# Patient Record
Sex: Female | Born: 1970
Health system: Southern US, Community
[De-identification: ages and names within clinical notes are randomized; demographics above are authoritative.]

## PROBLEM LIST (undated history)

## (undated) DIAGNOSIS — R569 Unspecified convulsions: Secondary | ICD-10-CM

## (undated) DIAGNOSIS — C801 Malignant (primary) neoplasm, unspecified: Secondary | ICD-10-CM

## (undated) DIAGNOSIS — Z5900 Homelessness unspecified: Secondary | ICD-10-CM

## (undated) DIAGNOSIS — I10 Essential (primary) hypertension: Secondary | ICD-10-CM

## (undated) DIAGNOSIS — E119 Type 2 diabetes mellitus without complications: Secondary | ICD-10-CM

## (undated) DIAGNOSIS — J449 Chronic obstructive pulmonary disease, unspecified: Secondary | ICD-10-CM

## (undated) DIAGNOSIS — H9190 Unspecified hearing loss, unspecified ear: Secondary | ICD-10-CM

## (undated) DIAGNOSIS — H548 Legal blindness, as defined in USA: Secondary | ICD-10-CM

## (undated) DIAGNOSIS — I509 Heart failure, unspecified: Secondary | ICD-10-CM

## (undated) HISTORY — PX: CHOLECYSTECTOMY: SHX55

## (undated) HISTORY — PX: BRAIN SURGERY: SHX531

## (undated) HISTORY — PX: TUBAL LIGATION: SHX77

---

## 2014-08-26 ENCOUNTER — Emergency Department (HOSPITAL_COMMUNITY)
Admission: EM | Admit: 2014-08-26 | Discharge: 2014-08-27 | Disposition: A | Discharge status: Home / Self Care | Payer: Medicaid Other | Attending: Emergency Medicine | Admitting: Emergency Medicine

## 2014-08-26 ENCOUNTER — Encounter (HOSPITAL_COMMUNITY): Payer: Self-pay | Admitting: Emergency Medicine

## 2014-08-26 DIAGNOSIS — Z91040 Latex allergy status: Secondary | ICD-10-CM | POA: Diagnosis not present

## 2014-08-26 DIAGNOSIS — I509 Heart failure, unspecified: Secondary | ICD-10-CM | POA: Diagnosis not present

## 2014-08-26 DIAGNOSIS — J449 Chronic obstructive pulmonary disease, unspecified: Secondary | ICD-10-CM | POA: Diagnosis not present

## 2014-08-26 DIAGNOSIS — R1030 Lower abdominal pain, unspecified: Secondary | ICD-10-CM | POA: Insufficient documentation

## 2014-08-26 DIAGNOSIS — E119 Type 2 diabetes mellitus without complications: Secondary | ICD-10-CM | POA: Diagnosis not present

## 2014-08-26 DIAGNOSIS — Z590 Homelessness: Secondary | ICD-10-CM | POA: Diagnosis not present

## 2014-08-26 DIAGNOSIS — Z720 Tobacco use: Secondary | ICD-10-CM | POA: Diagnosis not present

## 2014-08-26 DIAGNOSIS — I10 Essential (primary) hypertension: Secondary | ICD-10-CM | POA: Diagnosis not present

## 2014-08-26 DIAGNOSIS — Z8669 Personal history of other diseases of the nervous system and sense organs: Secondary | ICD-10-CM | POA: Diagnosis not present

## 2014-08-26 DIAGNOSIS — N939 Abnormal uterine and vaginal bleeding, unspecified: Secondary | ICD-10-CM | POA: Insufficient documentation

## 2014-08-26 DIAGNOSIS — M7989 Other specified soft tissue disorders: Secondary | ICD-10-CM | POA: Diagnosis present

## 2014-08-26 DIAGNOSIS — Z859 Personal history of malignant neoplasm, unspecified: Secondary | ICD-10-CM | POA: Insufficient documentation

## 2014-08-26 DIAGNOSIS — M79605 Pain in left leg: Secondary | ICD-10-CM | POA: Insufficient documentation

## 2014-08-26 DIAGNOSIS — M79606 Pain in leg, unspecified: Secondary | ICD-10-CM

## 2014-08-26 DIAGNOSIS — Z880 Allergy status to penicillin: Secondary | ICD-10-CM | POA: Insufficient documentation

## 2014-08-26 DIAGNOSIS — Z3202 Encounter for pregnancy test, result negative: Secondary | ICD-10-CM | POA: Diagnosis not present

## 2014-08-26 DIAGNOSIS — Z792 Long term (current) use of antibiotics: Secondary | ICD-10-CM | POA: Insufficient documentation

## 2014-08-26 DIAGNOSIS — M79604 Pain in right leg: Secondary | ICD-10-CM | POA: Diagnosis not present

## 2014-08-26 HISTORY — DX: Chronic obstructive pulmonary disease, unspecified: J44.9

## 2014-08-26 HISTORY — DX: Essential (primary) hypertension: I10

## 2014-08-26 HISTORY — DX: Heart failure, unspecified: I50.9

## 2014-08-26 HISTORY — DX: Type 2 diabetes mellitus without complications: E11.9

## 2014-08-26 HISTORY — DX: Malignant (primary) neoplasm, unspecified: C80.1

## 2014-08-26 HISTORY — DX: Homelessness unspecified: Z59.00

## 2014-08-26 HISTORY — DX: Homelessness: Z59.0

## 2014-08-26 HISTORY — DX: Unspecified convulsions: R56.9

## 2014-08-26 LAB — URINALYSIS, ROUTINE W REFLEX MICROSCOPIC
BILIRUBIN URINE: NEGATIVE
GLUCOSE, UA: NEGATIVE mg/dL
Hgb urine dipstick: NEGATIVE
KETONES UR: NEGATIVE mg/dL
Leukocytes, UA: NEGATIVE
Nitrite: NEGATIVE
PH: 5.5 (ref 5.0–8.0)
Protein, ur: NEGATIVE mg/dL
Specific Gravity, Urine: 1.022 (ref 1.005–1.030)
Urobilinogen, UA: 1 mg/dL (ref 0.0–1.0)

## 2014-08-26 LAB — CBC WITH DIFFERENTIAL/PLATELET
Basophils Absolute: 0 10*3/uL (ref 0.0–0.1)
Basophils Relative: 0 % (ref 0–1)
EOS PCT: 3 % (ref 0–5)
Eosinophils Absolute: 0.3 10*3/uL (ref 0.0–0.7)
HEMATOCRIT: 42.9 % (ref 39.0–52.0)
HEMOGLOBIN: 14.7 g/dL (ref 13.0–17.0)
LYMPHS ABS: 3 10*3/uL (ref 0.7–4.0)
LYMPHS PCT: 31 % (ref 12–46)
MCH: 30.2 pg (ref 26.0–34.0)
MCHC: 34.3 g/dL (ref 30.0–36.0)
MCV: 88.3 fL (ref 78.0–100.0)
MONO ABS: 0.6 10*3/uL (ref 0.1–1.0)
MONOS PCT: 7 % (ref 3–12)
Neutro Abs: 5.7 10*3/uL (ref 1.7–7.7)
Neutrophils Relative %: 59 % (ref 43–77)
PLATELETS: 238 10*3/uL (ref 150–400)
RBC: 4.86 MIL/uL (ref 4.22–5.81)
RDW: 13.8 % (ref 11.5–15.5)
WBC: 9.6 10*3/uL (ref 4.0–10.5)

## 2014-08-26 LAB — BASIC METABOLIC PANEL
Anion gap: 13 (ref 5–15)
BUN: 16 mg/dL (ref 6–23)
CHLORIDE: 99 meq/L (ref 96–112)
CO2: 24 meq/L (ref 19–32)
Calcium: 9.1 mg/dL (ref 8.4–10.5)
Creatinine, Ser: 0.85 mg/dL (ref 0.50–1.35)
GFR calc Af Amer: >90 mL/min (ref >90)
GFR calc non Af Amer: 82 mL/min — ABNORMAL LOW (ref >90)
GLUCOSE: 87 mg/dL (ref 70–99)
POTASSIUM: 3.6 meq/L — AB (ref 3.7–5.3)
Sodium: 136 mEq/L — ABNORMAL LOW (ref 137–147)

## 2014-08-26 LAB — PREGNANCY, URINE: Preg Test, Ur: NEGATIVE

## 2014-08-26 LAB — HCG, QUANTITATIVE, PREGNANCY: hCG, Beta Chain, Quant, S: <1 m[IU]/mL (ref <5)

## 2014-08-26 MED ORDER — ACETAMINOPHEN 500 MG PO TABS
1000.0000 mg | ORAL_TABLET | Freq: Once | ORAL | Status: AC
  Administered 2014-08-26: 1000 mg via ORAL
  Filled 2014-08-26: qty 2

## 2014-08-26 NOTE — ED Provider Notes (Signed)
CSN: [id number]     Arrival date & time 08/26/14  2103 History   First MD Initiated Contact with Patient 08/26/14 2121     Chief Complaint  Patient presents with  . Leg Swelling     (Consider location/radiation/quality/duration/timing/severity/associated sxs/prior Treatment) Patient is a 43 y.o. female presenting with leg pain and abdominal pain. The history is provided by the patient. No language interpreter was used.  Leg Pain Location:  Leg Injury: no   Leg location:  L lower leg and R lower leg Dislocation: no   Foreign body present:  No foreign bodies Associated symptoms: no fever   Associated symptoms comment:  Bilateral lower extremity pain for 2 weeks. She states she has been walking more than usual for that 2 week period. She denies swelling or redness. No injury. Abdominal Pain Pain location:  Suprapubic Associated symptoms: vaginal bleeding   Associated symptoms: no chills and no fever   Associated symptoms comment:  Lower abdominal pain and vaginal bleeding for the past week. She states she is 4 months pregnant but due to her homelessness she has not received prenatal care yet. She states she passed a large clot 4 days ago but she was staying in an abandoned house without lights and could not see anything in great detail. She denies significant bleeding since that time. No urinary symptoms, vomiting or fever.    Past Medical History  Diagnosis Date  . Hypertension   . Diabetes mellitus without complication   . COPD (chronic obstructive pulmonary disease)   . CHF (congestive heart failure)   . Seizures   . Cancer   . Homelessness    Past Surgical History  Procedure Laterality Date  . Brain surgery    . Cholecystectomy     No family history on file. History  Substance Use Topics  . Smoking status: Current Every Day Smoker  . Smokeless tobacco: Not on file  . Alcohol Use: No   OB History    Gravida Para Term Preterm AB TAB SAB Ectopic Multiple Living   1               Review of Systems  Constitutional: Negative for fever and chills.  HENT: Negative.   Respiratory: Negative.   Cardiovascular: Negative.   Gastrointestinal: Positive for abdominal pain.  Genitourinary: Positive for vaginal bleeding.  Musculoskeletal:       See HPI.  Skin: Negative.   Neurological: Negative.       Allergies  Azithromycin; Depakote; Ibuprofen; Latex; Penicillins; and Tramadol  Home Medications   Prior to Admission medications   Not on File   BP 166/100 mmHg  Pulse 86  Temp(Src) 97.8 F (36.6 C) (Oral)  Resp 20  SpO2 97% Physical Exam  Constitutional: She is oriented to person, place, and time. She appears well-developed and well-nourished.  HENT:  Head: Normocephalic.  Neck: Normal range of motion. Neck supple.  Cardiovascular: Normal rate and regular rhythm.   Pulmonary/Chest: Effort normal and breath sounds normal.  Abdominal: Soft. Bowel sounds are normal. There is no tenderness. There is no rebound and no guarding.  Abdomen is completely non-tender.   Musculoskeletal: Normal range of motion. She exhibits no edema.  LE's bilaterally are unremarkable in appearance. No redness, swelling, focal tenderness. Fully weight bearing on ambulation.  Neurological: She is alert and oriented to person, place, and time.  Skin: Skin is warm and dry. No rash noted.  Psychiatric: She has a normal mood and affect.  ED Course  Procedures (including critical care time) Labs Review Labs Reviewed  URINALYSIS, ROUTINE W REFLEX MICROSCOPIC  PREGNANCY, URINE  BASIC METABOLIC PANEL  HCG, QUANTITATIVE, PREGNANCY  CBC WITH DIFFERENTIAL   Results for orders placed or performed during the hospital encounter of 08/26/14  Urinalysis, Routine w reflex microscopic  Result Value Ref Range   Color, Urine YELLOW YELLOW   APPearance CLEAR CLEAR   Specific Gravity, Urine 1.022 1.005 - 1.030   pH 5.5 5.0 - 8.0   Glucose, UA NEGATIVE NEGATIVE mg/dL   Hgb urine  dipstick NEGATIVE NEGATIVE   Bilirubin Urine NEGATIVE NEGATIVE   Ketones, ur NEGATIVE NEGATIVE mg/dL   Protein, ur NEGATIVE NEGATIVE mg/dL   Urobilinogen, UA 1.0 0.0 - 1.0 mg/dL   Nitrite NEGATIVE NEGATIVE   Leukocytes, UA NEGATIVE NEGATIVE  Pregnancy, urine  Result Value Ref Range   Preg Test, Ur NEGATIVE NEGATIVE  Basic metabolic panel  Result Value Ref Range   Sodium 136 (L) 137 - 147 mEq/L   Potassium 3.6 (L) 3.7 - 5.3 mEq/L   Chloride 99 96 - 112 mEq/L   CO2 24 19 - 32 mEq/L   Glucose, Bld 87 70 - 99 mg/dL   BUN 16 6 - 23 mg/dL   Creatinine, Ser 0.85 0.50 - 1.10 mg/dL   Calcium 9.1 8.4 - 10.5 mg/dL   GFR calc non Af Amer 82 (L) >90 mL/min   GFR calc Af Amer >90 >90 mL/min   Anion gap 13 5 - 15  hCG, quantitative, pregnancy  Result Value Ref Range   hCG, Beta Chain, Quant, S <1 <5 mIU/mL  CBC with Differential  Result Value Ref Range   WBC 9.6 4.0 - 10.5 K/uL   RBC 4.86 3.87 - 5.11 MIL/uL   Hemoglobin 14.7 12.0 - 15.0 g/dL   HCT 42.9 36.0 - 46.0 %   MCV 88.3 78.0 - 100.0 fL   MCH 30.2 26.0 - 34.0 pg   MCHC 34.3 30.0 - 36.0 g/dL   RDW 13.8 11.5 - 15.5 %   Platelets 238 150 - 400 K/uL   Neutrophils Relative % 59 43 - 77 %   Neutro Abs 5.7 1.7 - 7.7 K/uL   Lymphocytes Relative 31 12 - 46 %   Lymphs Abs 3.0 0.7 - 4.0 K/uL   Monocytes Relative 7 3 - 12 %   Monocytes Absolute 0.6 0.1 - 1.0 K/uL   Eosinophils Relative 3 0 - 5 %   Eosinophils Absolute 0.3 0.0 - 0.7 K/uL   Basophils Relative 0 0 - 1 %   Basophils Absolute 0.0 0.0 - 0.1 K/uL    Imaging Review No results found.   EKG Interpretation None      MDM   Final diagnoses:  None    1. LE discomfort   The patient's urine and blood pregnancy tests are negative. She has a benign exam and normal labs. VSS. Lower extremities are not swollen or red and symptoms are bilateral - doubt DVT. She appears stable. Given Tylenol for pain.     Dewaine Oats, PA-C 08/26/14 [place], MD 08/27/14  0000

## 2014-08-26 NOTE — ED Notes (Signed)
Pt. reports progressing bilateral lower leg swelling for 2 weeks " walking a lot "  , denies injury or fall /ambulatory , pt. is homeless and 4 months pregnant , has not taken her insulin for several days . Denies pain / respirations unlabored .

## 2014-08-26 NOTE — Discharge Instructions (Signed)
FOLLOW UP WITH YOUR CHOICE OF PRIMARY CARE PROVIDER FOR FURTHER MANAGEMENT IN THE OUTPATIENT SETTING.    Emergency Department Resource Guide 1) Find a Doctor and Pay Out of Pocket Although you won't have to find out who is covered by your insurance plan, it is a good idea to ask around and get recommendations. You will then need to call the office and see if the doctor you have chosen will accept you as a new patient and what types of options they offer for patients who are self-pay. Some doctors offer discounts or will set up payment plans for their patients who do not have insurance, but you will need to ask so you aren't surprised when you get to your appointment.  2) Contact Your Local Health Department Not all health departments have doctors that can see patients for sick visits, but many do, so it is worth a call to see if yours does. If you don't know where your local health department is, you can check in your phone book. The CDC also has a tool to help you locate your state's health department, and many state websites also have listings of all of their local health departments.  3) Find a [facility] If your illness is not likely to be very severe or complicated, you may want to try a walk in clinic. These are popping up all over the country in pharmacies, drugstores, and shopping centers. They're usually staffed by nurse practitioners or physician assistants that have been trained to treat common illnesses and complaints. They're usually fairly quick and inexpensive. However, if you have serious medical issues or chronic medical problems, these are probably not your best option.  No Primary Care Doctor: - Call Health Connect at  [phone] - they can help you locate a primary care doctor that  accepts your insurance, provides certain services, etc. - Physician Referral Service- [phone]  Chronic Pain Problems: Organization         Address  Phone   Notes  [facility]  [phone] Patients need to be referred by their primary care doctor.   Medication Assistance: Organization         Address  Phone   Notes  [facility] Medication [facility] [address] [place], [place] [address] [phone] --Must be a resident of [facility] -- Must have NO insurance coverage whatsoever (no Medicaid/ Medicare, etc.) -- The pt. MUST have a primary care doctor that directs their care regularly and follows them in the community   MedAssist  [phone]   [employer]  [phone]    Agencies that provide inexpensive medical care: Organization         Address  Phone   Notes  [place]  [phone]   [facility]    [phone]   [facility] [place], [place] [address] [phone]   [place] [address], [place] [phone]   Planned Parenthood    [phone]   [facility]    [phone]   [place] and [address], [place] Phone:  [phone], Fax:  [phone] Hours of Operation:  9 am - 6 pm, M-F.  Also accepts Medicaid/Medicare and self-pay.  [facility] for [place] [address], [address], [place] Phone: [phone], Fax: [phone]. Hours of  Operation:  8:30 am - 5:30 pm, M-F.  Also accepts Medicaid and self-pay.  [facility] [place] [address] [place] Phone: [phone]   [place], [place], [place] [phone], Ext. [phone] Mondays & Thursdays: 7-9 AM.  First 15 patients are seen on a first come, first serve basis.    [place] Providers:  Organization         Address  Phone   Notes  [facility] [address], [place] [phone] Also accepts self-pay patients.  [facility] [address], [place]  [phone]   [place], [address], [place] [phone]   [facility] [address] [place] [phone]   Lucianne Lei [address], [place]   [phone] Only accepts [place] Access [place] patients after they have their name applied to their card.   Self-Pay (no insurance) in [facility]:  Organization         Address  Phone   Notes  Sickle Cell Patients, [facility] [place] [phone]   [facility]gent Care [place] [phone]   [facility] [place]  [place], [place], [place] [phone]   [facility]/Dr. Osei-Bonsu  [address], [place] or [address], [place] [phone] Phone number for both [place] and [place] locations is the same.  Urgent Medical and [facility] [address], [place] [phone]   [facility] [address], [place] or [address] [phone]   [facility] [address], [place] [phone], phone; [phone], fax Sees patients 1st and 3rd Saturday of every month.  Must not qualify for public or private insurance (i.e. Medicaid, Medicare, [place] Health Choice, Veterans' Benefits)  Household income should be no more than 200% of the poverty level The clinic cannot treat you if you are pregnant or think you are pregnant  Sexually transmitted diseases are not treated at the clinic.    Dental Care: Organization         Address  Phone  Notes  [facility] Department of [facility] [place] [phone] Accepts children up to age 56 who are enrolled in [place] or [place]; pregnant women with a Medicaid card; and children who have applied for Medicaid or [place] Health  Choice, but were declined, whose parents can pay a reduced fee at time of service.  [facility] Department of [facility]  [address], [place] [phone] Accepts children up to age 43 who are enrolled in [place] or [place]; pregnant women with a Medicaid card; and children who have applied for Medicaid or [place] Health Choice, but were declined, whose parents can pay a reduced fee at time of service.  [place] Adult Dental Access PROGRAM  [place] [phone] Patients are seen by appointment only. Walk-ins are not accepted. [place] will see patients 74 years of age and older. Monday - Tuesday (8am-5pm) Most Wednesdays (8:30-5pm) $30 per visit, cash only  [facility] Adult Dental Access PROGRAM  [address], [facility] [phone] Patients are seen  by appointment only. Walk-ins are not accepted. [place] will see patients 67 years of age and older. One Wednesday Evening (Monthly: Volunteer Based).  $30 per visit, cash only  [place]  [phone] for adults; Children under age 78, call Graduate Pediatric Dentistry at [phone]. Children aged 72-14, please call [phone] to request a pediatric application.  Dental services are provided in all areas of dental care including fillings, crowns and bridges, complete and partial dentures, implants, gum treatment, root canals, and extractions. Preventive care is also provided. Treatment is provided to both adults and children. Patients are selected via a lottery and there is often a waiting list.   [facility] [address], [place]  [phone] [email or web address]   [facility] [address] [place], [place] [phone], Ext. [phone] Second and Fourth Thursday of each month, opens at 6:30 AM; Clinic ends at 9 AM.  Patients are seen on a first-come first-served basis, and a limited number are seen during each  clinic.   [facility]  [address] Hillard Danker [place], [place] [phone]   Eligibility Requirements You must have lived in [place], [place], or [place] counties for at least the last three months.   You cannot be eligible for state or federal sponsored [employer], including [employer], [place], or [employer].   You generally cannot be eligible for healthcare insurance through your employer.    How to apply: Eligibility screenings are held every Tuesday and Wednesday afternoon from 1:00 pm until 4:00 pm. You do not need an appointment for the interview!  [facility] [address], [place], [place]   [place]  [place] Department  [place]  [phone]    Behavioral Health Resources in the Community: Intensive Outpatient Programs Organization         Address  Phone  Notes  [place]. [address], [place], [place] [phone]   [facility] Outpatient [address], [place], [place]   ADS: Alcohol & Drug Svcs [address], [place], [place]   [place] [address]  [place], [place] or [phone]   Substance Abuse Resources Organization         Address  Phone  Notes  Alcohol and Drug Services  [phone]   [place]  [phone]   The [place]   Chinita Pester  [phone]   Residential & Outpatient Substance Abuse Program  [phone]   Psychological Services Organization         Address  Phone  Notes  [facility] [place]  [place]  [phone]   [place] [address], [place] or [phone]    Mobile Crisis Teams Organization         Address  Phone  Notes  [facility], Mobile  Crisis Care Unit  [phone]   [facility] Psychotherapeutic Services  [address] [place], [place]   [place] [address] [place] [phone]    Self-Help/Support Groups Organization         Address  Phone             Notes  [place]. of [place] - variety of support groups  [place] Call for more  information  Narcotics Anonymous (NA), [facility] [address], [place]  2 meetings at this location   Residential Facilities manager         Address  Phone  Notes  [facility]idential Treatment [place],    [place]  [phone]   [facility]  [address] [phone], [place], [place]   [place], [place] [phone] Admissions: 8am-3pm M-F  Incentives Substance [place] [address],    [place], [place] [phone]   [facility] [address] [place], [place], [place]   [facility] [address],  [place], [place]   [facility]ight Programs - Intensive Outpatient [address], [place], [place]   [facility] [address]  [place], [place] [phone] or [phone]   [facility]idential Treatment Services (RTS) [address] [place], [place] Accepts Medicaid  [facility] [place] [address],  [place] [phone] Substance Abuse/Addiction Treatment   [facility] Organization         Address  Phone  Notes  [facility]  [phone]   Domenic Schwab, PhD [address] Arlis [place], [place]   [phone] or [phone]   [place], [place] [phone]   [facility] [address], [place], [place] [phone] Insurance/Medicaid/sponsorship through [facility] and Families [address]  [place]                                    [place], [place] [phone] [place] [address], [place] [phone]    Dr. Adele Schilder  [phone]   [facility] Dept. 1) [address] [place] 2) [place] 3)  [place] [address], [place] [phone]  [phone]   [place] [phone] or [phone] (After Hours)

## 2014-08-26 NOTE — ED Notes (Addendum)
Called and spoke with OB- rapid response.  Pt not far enough along at this point for their assessment.

## 2014-09-25 ENCOUNTER — Emergency Department (HOSPITAL_COMMUNITY): Payer: Self-pay

## 2014-09-25 ENCOUNTER — Emergency Department (HOSPITAL_COMMUNITY)
Admission: EM | Admit: 2014-09-25 | Discharge: 2014-09-25 | Disposition: A | Discharge status: Home / Self Care | Payer: Self-pay | Attending: Emergency Medicine | Admitting: Emergency Medicine

## 2014-09-25 DIAGNOSIS — Z91040 Latex allergy status: Secondary | ICD-10-CM | POA: Insufficient documentation

## 2014-09-25 DIAGNOSIS — R112 Nausea with vomiting, unspecified: Secondary | ICD-10-CM

## 2014-09-25 DIAGNOSIS — Z880 Allergy status to penicillin: Secondary | ICD-10-CM | POA: Insufficient documentation

## 2014-09-25 DIAGNOSIS — R197 Diarrhea, unspecified: Secondary | ICD-10-CM | POA: Insufficient documentation

## 2014-09-25 DIAGNOSIS — R4182 Altered mental status, unspecified: Secondary | ICD-10-CM

## 2014-09-25 DIAGNOSIS — Z79899 Other long term (current) drug therapy: Secondary | ICD-10-CM | POA: Insufficient documentation

## 2014-09-25 DIAGNOSIS — G40909 Epilepsy, unspecified, not intractable, without status epilepticus: Secondary | ICD-10-CM | POA: Insufficient documentation

## 2014-09-25 DIAGNOSIS — R569 Unspecified convulsions: Secondary | ICD-10-CM

## 2014-09-25 DIAGNOSIS — R109 Unspecified abdominal pain: Secondary | ICD-10-CM | POA: Insufficient documentation

## 2014-09-25 LAB — RAPID URINE DRUG SCREEN, HOSP PERFORMED
AMPHETAMINES: NOT DETECTED
Barbiturates: NOT DETECTED
Benzodiazepines: POSITIVE — AB
Cocaine: NOT DETECTED
Opiates: NOT DETECTED
Tetrahydrocannabinol: NOT DETECTED

## 2014-09-25 LAB — COMPREHENSIVE METABOLIC PANEL
ALT: 23 U/L (ref 0–35)
AST: 21 U/L (ref 0–37)
Albumin: 3.4 g/dL — ABNORMAL LOW (ref 3.5–5.2)
Alkaline Phosphatase: 73 U/L (ref 39–117)
Anion gap: 7 (ref 5–15)
BUN: 15 mg/dL (ref 6–23)
CALCIUM: 8.6 mg/dL (ref 8.4–10.5)
CO2: 24 mmol/L (ref 19–32)
Chloride: 106 mEq/L (ref 96–112)
Creatinine, Ser: 0.75 mg/dL (ref 0.50–1.10)
GFR calc Af Amer: >90 mL/min (ref >90)
Glucose, Bld: 108 mg/dL — ABNORMAL HIGH (ref 70–99)
POTASSIUM: 3.8 mmol/L (ref 3.5–5.1)
Sodium: 137 mmol/L (ref 135–145)
Total Bilirubin: 0.4 mg/dL (ref 0.3–1.2)
Total Protein: 5.8 g/dL — ABNORMAL LOW (ref 6.0–8.3)

## 2014-09-25 LAB — CBC WITH DIFFERENTIAL/PLATELET
Basophils Absolute: 0 10*3/uL (ref 0.0–0.1)
Basophils Relative: 0 % (ref 0–1)
EOS ABS: 0.2 10*3/uL (ref 0.0–0.7)
Eosinophils Relative: 3 % (ref 0–5)
HEMATOCRIT: 42.1 % (ref 36.0–46.0)
HEMOGLOBIN: 14.4 g/dL (ref 12.0–15.0)
Lymphocytes Relative: 34 % (ref 12–46)
Lymphs Abs: 2.8 10*3/uL (ref 0.7–4.0)
MCH: 29.8 pg (ref 26.0–34.0)
MCHC: 34.2 g/dL (ref 30.0–36.0)
MCV: 87.2 fL (ref 78.0–100.0)
MONOS PCT: 9 % (ref 3–12)
Monocytes Absolute: 0.7 10*3/uL (ref 0.1–1.0)
NEUTROS ABS: 4.4 10*3/uL (ref 1.7–7.7)
Neutrophils Relative %: 54 % (ref 43–77)
Platelets: 222 10*3/uL (ref 150–400)
RBC: 4.83 MIL/uL (ref 3.87–5.11)
RDW: 13.9 % (ref 11.5–15.5)
WBC: 8.2 10*3/uL (ref 4.0–10.5)

## 2014-09-25 LAB — URINALYSIS, ROUTINE W REFLEX MICROSCOPIC
Bilirubin Urine: NEGATIVE
Glucose, UA: NEGATIVE mg/dL
Hgb urine dipstick: NEGATIVE
Ketones, ur: NEGATIVE mg/dL
Nitrite: NEGATIVE
PROTEIN: NEGATIVE mg/dL
Specific Gravity, Urine: 1.019 (ref 1.005–1.030)
Urobilinogen, UA: 0.2 mg/dL (ref 0.0–1.0)
pH: 5.5 (ref 5.0–8.0)

## 2014-09-25 LAB — PHENYTOIN LEVEL, TOTAL: Phenytoin Lvl: <2.5 ug/mL — ABNORMAL LOW (ref 10.0–20.0)

## 2014-09-25 LAB — URINE MICROSCOPIC-ADD ON

## 2014-09-25 LAB — ETHANOL

## 2014-09-25 MED ORDER — ONDANSETRON HCL 4 MG PO TABS: 4.0000 mg | ORAL_TABLET | Freq: Three times a day (TID) | ORAL | Status: AC | PRN

## 2014-09-25 MED ORDER — SODIUM CHLORIDE 0.9 % IV SOLN: 1000.0000 mg | Freq: Once | INTRAVENOUS | Status: DC

## 2014-09-25 MED ORDER — SODIUM CHLORIDE 0.9 % IV SOLN
1000.0000 mg | Freq: Once | INTRAVENOUS | Status: AC
  Administered 2014-09-25: 1000 mg via INTRAVENOUS
  Filled 2014-09-25: qty 20

## 2014-09-25 MED ORDER — CYCLOBENZAPRINE HCL 10 MG PO TABS: 10.0000 mg | ORAL_TABLET | Freq: Once | ORAL | Status: DC

## 2014-09-25 MED ORDER — LEVETIRACETAM 750 MG PO TABS
1500.0000 mg | ORAL_TABLET | Freq: Two times a day (BID) | ORAL | Status: DC
  Filled 2014-09-25: qty 2

## 2014-09-25 MED ORDER — LEVETIRACETAM IN NACL 1000 MG/100ML IV SOLN
1000.0000 mg | Freq: Once | INTRAVENOUS | Status: AC
  Administered 2014-09-25: 1000 mg via INTRAVENOUS
  Filled 2014-09-25: qty 100

## 2014-09-25 MED ORDER — LEVETIRACETAM IN NACL 500 MG/100ML IV SOLN
500.0000 mg | Freq: Once | INTRAVENOUS | Status: AC
  Administered 2014-09-25: 500 mg via INTRAVENOUS
  Filled 2014-09-25: qty 100

## 2014-09-25 MED ORDER — PHENYTOIN SODIUM 50 MG/ML IJ SOLN: 100.0000 mg | Freq: Once | INTRAMUSCULAR | Status: DC

## 2014-09-25 MED ORDER — CARBAMAZEPINE ER 200 MG PO TB12
200.0000 mg | ORAL_TABLET | Freq: Two times a day (BID) | ORAL | Status: DC
  Filled 2014-09-25 (×3): qty 1

## 2014-09-25 MED ORDER — PHENYTOIN SODIUM EXTENDED 100 MG PO CAPS: 100.0000 mg | ORAL_CAPSULE | Freq: Three times a day (TID) | ORAL | Status: DC

## 2014-09-25 MED ORDER — LORAZEPAM 2 MG/ML IJ SOLN
INTRAMUSCULAR | Status: AC
  Administered 2014-09-25: 1 mg
  Filled 2014-09-25: qty 1

## 2014-09-25 MED ORDER — LEVETIRACETAM 750 MG PO TABS
1500.0000 mg | ORAL_TABLET | Freq: Two times a day (BID) | ORAL | Status: DC
  Filled 2014-09-25 (×2): qty 2

## 2014-09-25 NOTE — ED Notes (Signed)
Knapp MD at bedside  

## 2014-09-25 NOTE — Consult Note (Signed)
Neurology Consultation Reason for Consult: Seizure Referring Physician: Tomi Bamberger, I  CC: Seizure  History is obtained from: Husband  HPI: Kathy Ellis is a 44 y.o. female with a history of seizures since childhood managed on Tegretol, phenytoin, Keppra who presents with breakthrough seizures coming recurrently. She states that she may have missed some of her medications. She typically takes Tegretol 200 mg twice a day, Keppra 1500 mg twice a day, phenytoin 100 mg 3 times a day. She states that she has run out of the Tegretol has not been taking it for several days. She also reports that she was recently decreased from 200 mg 3 times a day of Dilantin to 100 mg 3 times a day.   Her husband states that she typically will turn to the left prior to generalizing. She did this tonight. Her husband states that he was on the phone with 911 because she was having pain and threw up blood while he was on the phone with the EMS dispatcher, she began having seizure. This lasted 5-6 minutes, but then she had another seizure en route, and then finally a third seizure in the emergency room. It aborted with Ativan.  ROS: A 14 point ROS was performed and is negative except as noted in the HPI.   PMH: Epilepsy CHF  Family History: Son - seizure  Social History: Tob: current smoker  Exam: Current vital signs: LMP  (LMP Unknown) Vital signs in last 24 hours:     Physical Exam  Constitutional: Appears well-developed and well-nourished.  Psych: Affect appropriate to situation Eyes: No scleral injection HENT: No OP obstrucion Head: Normocephalic.  Cardiovascular: Normal rate and regular rhythm.  Respiratory: Effort normal  GI: Soft.  No distension. There is no tenderness.  Skin: WDI  Neuro: Mental Status: Patient is awake, alert, oriented to person, not place or year Patient is post-ictal No signs of aphasia or neglect Cranial Nerves: II: Visual Fields are full. Pupils are equal, round, and  reactive to light.   III,IV, VI: EOMI without ptosis or diploplia.  V: Facial sensation is symmetric to temperature VII: Facial movement is symmetric.  VIII: hearing is intact to voice X: Uvula elevates symmetrically XI: Shoulder shrug is symmetric. XII: tongue is midline without atrophy or fasciculations.  Motor: Tone is normal. Bulk is normal. 5/5 strength was present in all four extremities.  Sensory: Sensation is symmetric to light touch and temperature in the arms and legs. Deep Tendon Reflexes: 2+ and symmetric in the biceps and patellae.     I have reviewed labs in epic and the results pertinent to this consultation are: CMP-unremarkable Phenytoin level less than 2.5   Impression: 44 year old female who presents with recurrent seizures in the setting of medication noncompliance.  Recommendations: 1) restart home medications including: Dilantin 100 mg 3 times a day 2) Keppra 1500 mg twice a day 3) Tegretol 200 mg twice a day 4) evaluation of possible hematemesis per ER physician   Roland Rack, MD [facility] [phone]  If 7pm- 7am, please page neurology on call as listed in [place].
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
## 2014-09-25 NOTE — Discharge Instructions (Signed)
Drink plenty of fluids. Take the zofran for nausea or vomiting. Take imodium OTC for diarrhea. Restart your seizure medications today as you normally would take them.  Call the [facility] or look at the resource guide to get a primary care doctor.  Recheck if you feel worse again.     Emergency Department Resource Guide 1) Find a Doctor and Pay Out of Pocket Although you won't have to find out who is covered by your insurance plan, it is a good idea to ask around and get recommendations. You will then need to call the office and see if the doctor you have chosen will accept you as a new patient and what types of options they offer for patients who are self-pay. Some doctors offer discounts or will set up payment plans for their patients who do not have insurance, but you will need to ask so you aren't surprised when you get to your appointment.  2) Contact Your Local Health Department Not all health departments have doctors that can see patients for sick visits, but many do, so it is worth a call to see if yours does. If you don't know where your local health department is, you can check in your phone book. The CDC also has a tool to help you locate your state's health department, and many state websites also have listings of all of their local health departments.  3) Find a [facility] If your illness is not likely to be very severe or complicated, you may want to try a walk in clinic. These are popping up all over the country in pharmacies, drugstores, and shopping centers. They're usually staffed by nurse practitioners or physician assistants that have been trained to treat common illnesses and complaints. They're usually fairly quick and inexpensive. However, if you have serious medical issues or chronic medical problems, these are probably not your best option.  No Primary Care Doctor: - Call Health Connect at  [phone] - they can help you locate a primary care doctor that  accepts your  insurance, provides certain services, etc. - Physician Referral Service- [phone]  Chronic Pain Problems: Organization         Address  Phone   Notes  [facility]  [phone] Patients need to be referred by their primary care doctor.   Medication Assistance: Organization         Address  Phone   Notes  [facility] Medication [facility] [address] [place], [place] [address] [phone] --Must be a resident of [facility] -- Must have NO insurance coverage whatsoever (no Medicaid/ Medicare, etc.) -- The pt. MUST have a primary care doctor that directs their care regularly and follows them in the community   MedAssist  [phone]   [employer]  [phone]    Agencies that provide inexpensive medical care: Organization         Address  Phone   Notes  [place]  [phone]   [facility]    [phone]   [facility] [place], [place] [address] [phone]   [place] [address] [place] [phone]   Planned Parenthood    [phone]   [facility]    [phone]   [place] and [place] [address], [place] Phone:  [phone], Fax:  [phone] Hours of  Operation:  9 am - 6 pm, M-F.  Also accepts Medicaid/Medicare and self-pay.  [facility] for [place], [address], [place] Phone: [phone], Fax: [phone]. Hours of Operation:  8:30 am - 5:30 pm, M-F.  Also accepts Medicaid and self-pay.  [facility] [place] [address], [place] Phone: [phone]   [place], [place], [place] [phone], Ext. [phone] Mondays & Thursdays: 7-9 AM.  First 15 patients are seen on a first come, first serve basis.    [place] Providers:  Organization          Address  Phone   Notes  [facility] [address], [place] [phone] Also accepts self-pay patients.  [facility] [address], [place]  [phone]   [place], [address], [place] [phone]   [facility] [address] [place] [phone]   Lucianne Lei [address], [place]   [phone] Only accepts [place] Access [place] patients after they have their name applied to their card.   Self-Pay (no insurance) in [facility]:  Organization         Address  Phone   Notes  Sickle Cell Patients, [facility] [place] [phone]   [facility] [place] [phone]   [facility] [place]  [place], [place],  [phone]   [facility]/Dr. Osei-Bonsu  [address] [place] [address], [place] [phone] Phone number for both [place] and [place] locations is the same.  Urgent Medical and [facility] [address] [place] [phone]   [facility] [address], [place] or [address] [phone]   [place] [address] [place] [phone], phone; [phone], fax Sees patients 1st and 3rd Saturday of every month.  Must not qualify for public or private insurance (i.e. Medicaid, Medicare, [place] Health Choice, Veterans' Benefits)  Household income should be no more than 200% of the poverty level The clinic cannot treat you if you are pregnant or think you are pregnant  Sexually transmitted diseases are not treated at the clinic.    Dental Care: Organization         Address  Phone  Notes  [facility] Department of [facility] [place] [phone] Accepts children up to age 59 who are enrolled in [place] or [place]; pregnant women with a Medicaid card; and children who have applied for Medicaid or [place] Health Choice, but were declined, whose parents can pay a reduced fee at time of service.  [facility] Department of [facility]  [address], [place] [phone] Accepts children up to age 34 who are enrolled in [place] or [place]; pregnant women with a Medicaid card; and children who have applied for Medicaid or [place] Health Choice, but were declined, whose parents can pay a reduced fee at time of service.  [place] Adult Dental Access PROGRAM  [place] [phone] Patients are seen by appointment only. Walk-ins are not accepted. Arnold Line will see patients  36 years of age and older. Monday - Tuesday (8am-5pm) Most Wednesdays (8:30-5pm) $30 per visit, cash only  [facility] Adult Dental Access PROGRAM  [address], [facility] [phone] Patients are seen by appointment only. Walk-ins are not accepted. [place] will see patients 34 years of age and older. One Wednesday Evening (Monthly: Volunteer Based).  $30 per visit, cash only  [place]  [phone] for adults; Children under age 73, call Graduate Pediatric Dentistry at [phone]. Children aged 46-14, please call [phone] to request a pediatric application.  Dental services are provided in all areas of dental care including fillings, crowns and bridges, complete and partial dentures, implants, gum treatment, root canals, and extractions. Preventive care is also provided. Treatment is provided to both adults and children. Patients are selected via a lottery and there is often a waiting list.   [facility] [address] [place]  [phone] [email or web address]   [facility] [address] [place], [place]  [phone], Ext. [phone] Second and Fourth Thursday of each month, opens at 6:30 AM; Clinic ends at 9 AM.  Patients are seen on a first-come first-served basis, and a limited number are seen during each clinic.   [facility]  [address] [place] Danker [place], [place] [phone]   Eligibility Requirements You must have lived in [place], [place], or [place] counties for at least the last three months.   You cannot be eligible for state or federal sponsored [employer], including [employer], [place], or [employer].   You generally cannot be eligible for healthcare insurance through your employer.    How to apply: Eligibility screenings are held every Tuesday and Wednesday afternoon from 1:00 pm until 4:00 pm. You do not need an appointment for the interview!  [facility] [address] [place], [place]   [place]  [place] Department  [place]  [phone]    Behavioral Health Resources in the Community: Intensive Outpatient Programs Organization         Address  Phone  Notes  [facility] [place]. [address] [place], [place] [phone]   [facility] Outpatient [address], [place], [place]   ADS: Alcohol & Drug Svcs [address], [place], [place]   [place] [address]  [place], [place] or [phone]   Substance Abuse Resources Organization         Address  Phone  Notes  Alcohol and Drug Services  [phone]   [place]  [phone]   [facility] [place]   Chinita Pester  [phone]   Residential & Outpatient Substance Abuse Program  [phone]   Psychological Services Organization         Address  Phone  Notes  [facility] [place]  [place]  [phone]    [place] [address], [place] or [phone]    Mobile Crisis Teams Organization         Address  Phone  Notes  [facility], Mobile Crisis Care Unit  [phone]   Assertive Psychotherapeutic Services  [address] [place], [place]   [facility] [address] [place] [phone]    Self-Help/Support Groups Organization  Address  Phone             Notes  [place]. of [place] - variety of support groups  [place] Call for more information  Narcotics Anonymous (NA), [facility] [address], [employer] [place]  2 meetings at this location   Special educational needs teacher         Address  Phone  Notes  [facility]AP Residential Treatment [place],    [place]  [phone]   [facility]  [address] [place] [address], [place], [place]   [place], [place] [phone] Admissions: 8am-3pm M-F  Incentives Substance [place] [address],    [place], [place] [phone]   [facility] [address] [place], [place], [place]   [facility] [address]  [place], [place]   Insight Programs - Intensive Outpatient [address], [place], [place]   [facility] ([place].,  [place], [place] [phone] or [phone]   [facility]idential Treatment Services (RTS) [address] [place], [place] Accepts Medicaid  [facility] [place] [address]  [place] [phone] Substance Abuse/Addiction Treatment   [facility] Organization         Address  Phone  Notes  [facility]  [phone]   Domenic Schwab, PhD [address] [place]lis [place], [place]   [phone] or [phone]   [place]  [place], [place] [phone]   [facility] [address] [place], [place] [phone] Insurance/Medicaid/sponsorship through [facility] and Families [address] [place]                                    [place], [place] [phone] [place] [address], [place] [phone]    Dr. Adele Schilder  [phone]   [facility] [place] Dept. 1) [address], [place] 2) [place] 3)  [address], [place] [phone]  [phone]   [place] [phone] or [phone] (After Hours)

## 2014-09-25 NOTE — ED Notes (Signed)
Per EMS: Pt began seizing @2343 , EMS gave total of 10mg  Midazolam. Seizure terminated @ 0013. BP 140/96, 115bpm. Pt takes Dilantin and Kepra, may have missed a dose in the last day or so per EMS. Pt post ictal, arousable to voice. A/O x 4.

## 2014-11-02 ENCOUNTER — Emergency Department (HOSPITAL_COMMUNITY): Payer: Medicaid Other

## 2014-11-02 ENCOUNTER — Encounter (HOSPITAL_COMMUNITY): Payer: Self-pay | Admitting: Emergency Medicine

## 2014-11-02 ENCOUNTER — Emergency Department (HOSPITAL_COMMUNITY)
Admission: EM | Admit: 2014-11-02 | Discharge: 2014-11-02 | Disposition: A | Discharge status: Home / Self Care | Payer: Medicaid Other | Attending: Emergency Medicine | Admitting: Emergency Medicine

## 2014-11-02 DIAGNOSIS — M545 Low back pain, unspecified: Secondary | ICD-10-CM

## 2014-11-02 DIAGNOSIS — Z794 Long term (current) use of insulin: Secondary | ICD-10-CM | POA: Insufficient documentation

## 2014-11-02 DIAGNOSIS — R42 Dizziness and giddiness: Secondary | ICD-10-CM | POA: Insufficient documentation

## 2014-11-02 DIAGNOSIS — G40909 Epilepsy, unspecified, not intractable, without status epilepticus: Secondary | ICD-10-CM | POA: Diagnosis not present

## 2014-11-02 DIAGNOSIS — Z859 Personal history of malignant neoplasm, unspecified: Secondary | ICD-10-CM | POA: Diagnosis not present

## 2014-11-02 DIAGNOSIS — G8929 Other chronic pain: Secondary | ICD-10-CM | POA: Insufficient documentation

## 2014-11-02 DIAGNOSIS — Z720 Tobacco use: Secondary | ICD-10-CM | POA: Diagnosis not present

## 2014-11-02 DIAGNOSIS — R05 Cough: Secondary | ICD-10-CM | POA: Insufficient documentation

## 2014-11-02 DIAGNOSIS — Y9289 Other specified places as the place of occurrence of the external cause: Secondary | ICD-10-CM | POA: Diagnosis not present

## 2014-11-02 DIAGNOSIS — Y9389 Activity, other specified: Secondary | ICD-10-CM | POA: Insufficient documentation

## 2014-11-02 DIAGNOSIS — I10 Essential (primary) hypertension: Secondary | ICD-10-CM | POA: Insufficient documentation

## 2014-11-02 DIAGNOSIS — S3992XA Unspecified injury of lower back, initial encounter: Secondary | ICD-10-CM | POA: Insufficient documentation

## 2014-11-02 DIAGNOSIS — Z590 Homelessness: Secondary | ICD-10-CM | POA: Diagnosis not present

## 2014-11-02 DIAGNOSIS — J441 Chronic obstructive pulmonary disease with (acute) exacerbation: Secondary | ICD-10-CM | POA: Insufficient documentation

## 2014-11-02 DIAGNOSIS — Z880 Allergy status to penicillin: Secondary | ICD-10-CM | POA: Insufficient documentation

## 2014-11-02 DIAGNOSIS — H9190 Unspecified hearing loss, unspecified ear: Secondary | ICD-10-CM | POA: Diagnosis not present

## 2014-11-02 DIAGNOSIS — Z7982 Long term (current) use of aspirin: Secondary | ICD-10-CM | POA: Diagnosis not present

## 2014-11-02 DIAGNOSIS — W1839XA Other fall on same level, initial encounter: Secondary | ICD-10-CM | POA: Insufficient documentation

## 2014-11-02 DIAGNOSIS — Y998 Other external cause status: Secondary | ICD-10-CM | POA: Insufficient documentation

## 2014-11-02 DIAGNOSIS — Z79899 Other long term (current) drug therapy: Secondary | ICD-10-CM | POA: Diagnosis not present

## 2014-11-02 DIAGNOSIS — Z91040 Latex allergy status: Secondary | ICD-10-CM | POA: Diagnosis not present

## 2014-11-02 DIAGNOSIS — Z7902 Long term (current) use of antithrombotics/antiplatelets: Secondary | ICD-10-CM | POA: Diagnosis not present

## 2014-11-02 DIAGNOSIS — W19XXXA Unspecified fall, initial encounter: Secondary | ICD-10-CM

## 2014-11-02 DIAGNOSIS — E119 Type 2 diabetes mellitus without complications: Secondary | ICD-10-CM | POA: Diagnosis not present

## 2014-11-02 HISTORY — DX: Unspecified hearing loss, unspecified ear: H91.90

## 2014-11-02 LAB — URINALYSIS, ROUTINE W REFLEX MICROSCOPIC
BILIRUBIN URINE: NEGATIVE
GLUCOSE, UA: NEGATIVE mg/dL
KETONES UR: NEGATIVE mg/dL
NITRITE: NEGATIVE
PH: 5 (ref 5.0–8.0)
PROTEIN: 30 mg/dL — AB
Specific Gravity, Urine: 1.025 (ref 1.005–1.030)
Urobilinogen, UA: 0.2 mg/dL (ref 0.0–1.0)

## 2014-11-02 LAB — RAPID URINE DRUG SCREEN, HOSP PERFORMED
AMPHETAMINES: NOT DETECTED
Barbiturates: NOT DETECTED
Benzodiazepines: NOT DETECTED
Cocaine: NOT DETECTED
OPIATES: NOT DETECTED
Tetrahydrocannabinol: NOT DETECTED

## 2014-11-02 LAB — URINE MICROSCOPIC-ADD ON

## 2014-11-02 LAB — POC URINE PREG, ED: PREG TEST UR: NEGATIVE

## 2014-11-02 MED ORDER — MECLIZINE HCL 25 MG PO TABS
25.0000 mg | ORAL_TABLET | Freq: Once | ORAL | Status: AC
  Administered 2014-11-02: 25 mg via ORAL
  Filled 2014-11-02: qty 1

## 2014-11-02 MED ORDER — MECLIZINE HCL 25 MG PO TABS: 25.0000 mg | ORAL_TABLET | Freq: Two times a day (BID) | ORAL | Status: DC | PRN
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
## 2014-11-02 NOTE — ED Notes (Signed)
Patient transported to CT 

## 2014-11-02 NOTE — Discharge Instructions (Signed)
Please call your doctor for a followup appointment within 24-48 hours. When you talk to your doctor please let them know that you were seen in the emergency department and have them acquire all of your records so that they can discuss the findings with you and formulate a treatment plan to fully care for your new and ongoing problems. Please keep appointment with your primary care provider on Wednesday - 11/04/2014 Please rest and stay hydrated Please avoid any physical strenuous activity Please apply warm compressions and massage with icy hot ointment for back relief Please avoid any heavy lifting Please take medications as prescribed on a full stomach Please continue to monitor symptoms closely and if symptoms are to worsen or change (fever greater than 101, chills, sweating, nausea, vomiting, chest pain, shortness of breathe, difficulty breathing, weakness, numbness, tingling, worsening or changes to pain pattern, fall, injury, inability to control urine or bowel movements, complete loss of sensation to lower extremities, head injury) please report back to the Emergency Department immediately.   Back Pain, Adult Back pain is very common. The pain often gets better over time. The cause of back pain is usually not dangerous. Most people can learn to manage their back pain on their own.  HOME CARE   Stay active. Start with short walks on flat ground if you can. Try to walk farther each day.  Do not sit, drive, or stand in one place for more than 30 minutes. Do not stay in bed.  Do not avoid exercise or work. Activity can help your back heal faster.  Be careful when you bend or lift an object. Bend at your knees, keep the object close to you, and do not twist.  Sleep on a firm mattress. Lie on your side, and bend your knees. If you lie on your back, put a pillow under your knees.  Only take medicines as told by your doctor.  Put ice on the injured area.  Put ice in a plastic bag.  Place a  towel between your skin and the bag.  Leave the ice on for 15-20 minutes, 03-04 times a day for the first 2 to 3 days. After that, you can switch between ice and heat packs.  Ask your doctor about back exercises or massage.  Avoid feeling anxious or stressed. Find good ways to deal with stress, such as exercise. GET HELP RIGHT AWAY IF:   Your pain does not go away with rest or medicine.  Your pain does not go away in 1 week.  You have new problems.  You do not feel well.  The pain spreads into your legs.  You cannot control when you poop (bowel movement) or pee (urinate).  Your arms or legs feel weak or lose feeling (numbness).  You feel sick to your stomach (nauseous) or throw up (vomit).  You have belly (abdominal) pain.  You feel like you may pass out (faint). MAKE SURE YOU:   Understand these instructions.  Will watch your condition.  Will get help right away if you are not doing well or get worse. Document Released: 02/14/2008 Document Revised: 11/20/2011 Document Reviewed: 12/30/2013 [facility] Patient Information 2015 [place], [place]. This information is not intended to replace advice given to you by your health care provider. Make sure you discuss any questions you have with your health care provider.  Vertigo Vertigo means you feel like you or your surroundings are moving when they are not. Vertigo can be dangerous if it occurs when you  are at work, driving, or performing difficult activities.  CAUSES  Vertigo occurs when there is a conflict of signals sent to your brain from the visual and sensory systems in your body. There are many different causes of vertigo, including:  Infections, especially in the inner ear.  A bad reaction to a drug or misuse of alcohol and medicines.  Withdrawal from drugs or alcohol.  Rapidly changing positions, such as lying down or rolling over in bed.  A migraine headache.  Decreased blood flow to the brain.  Increased  pressure in the brain from a head injury, infection, tumor, or bleeding. SYMPTOMS  You may feel as though the world is spinning around or you are falling to the ground. Because your balance is upset, vertigo can cause nausea and vomiting. You may have involuntary eye movements (nystagmus). DIAGNOSIS  Vertigo is usually diagnosed by physical exam. If the cause of your vertigo is unknown, your caregiver may perform imaging tests, such as an MRI scan (magnetic resonance imaging). TREATMENT  Most cases of vertigo resolve on their own, without treatment. Depending on the cause, your caregiver may prescribe certain medicines. If your vertigo is related to body position issues, your caregiver may recommend movements or procedures to correct the problem. In rare cases, if your vertigo is caused by certain inner ear problems, you may need surgery. HOME CARE INSTRUCTIONS   Follow your caregiver's instructions.  Avoid driving.  Avoid operating heavy machinery.  Avoid performing any tasks that would be dangerous to you or others during a vertigo episode.  Tell your caregiver if you notice that certain medicines seem to be causing your vertigo. Some of the medicines used to treat vertigo episodes can actually make them worse in some people. SEEK IMMEDIATE MEDICAL CARE IF:   Your medicines do not relieve your vertigo or are making it worse.  You develop problems with talking, walking, weakness, or using your arms, hands, or legs.  You develop severe headaches.  Your nausea or vomiting continues or gets worse.  You develop visual changes.  A family member notices behavioral changes.  Your condition gets worse. MAKE SURE YOU:  Understand these instructions.  Will watch your condition.  Will get help right away if you are not doing well or get worse. Document Released: 06/07/2005 Document Revised: 11/20/2011 Document Reviewed: 03/16/2011 [facility] Patient Information 2015 [place], [place]. This  information is not intended to replace advice given to you by your health care provider. Make sure you discuss any questions you have with your health care provider.

## 2014-11-02 NOTE — ED Notes (Signed)
12 lead given to Dr. Canary Brim

## 2014-11-02 NOTE — ED Notes (Signed)
Kohut, MD at bedside. 

## 2014-11-02 NOTE — ED Notes (Signed)
Pt placed in waiting room until room available per order from triage nurse

## 2014-11-02 NOTE — ED Notes (Signed)
Pt and companion yelling and staff and demanding to talk with doctor about pain medication. Wilson Singer, MD notified.

## 2014-11-02 NOTE — ED Notes (Signed)
Pt used the bathroom before coming back to room, but pt knows that a urine sample is needed and will go when she can.
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
## 2014-11-06 ENCOUNTER — Other Ambulatory Visit: Payer: Self-pay | Admitting: Orthopedic Surgery

## 2014-11-06 DIAGNOSIS — M545 Low back pain: Secondary | ICD-10-CM

## 2014-11-18 LAB — CBC
HCT: 42.6 % (ref 36.0–46.0)
Hemoglobin: 15 g/dL (ref 12.0–15.0)
MCH: 30.5 pg (ref 26.0–34.0)
MCHC: 35.2 g/dL (ref 30.0–36.0)
MCV: 86.8 fL (ref 78.0–100.0)
Platelets: 241 10*3/uL (ref 150–400)
RBC: 4.91 MIL/uL (ref 3.87–5.11)
RDW: 13.6 % (ref 11.5–15.5)
WBC: 10.3 10*3/uL (ref 4.0–10.5)

## 2014-11-18 LAB — BASIC METABOLIC PANEL
Anion gap: 9 (ref 5–15)
BUN: 23 mg/dL (ref 6–23)
CO2: 25 mmol/L (ref 19–32)
Calcium: 9.4 mg/dL (ref 8.4–10.5)
Chloride: 103 mmol/L (ref 96–112)
Creatinine, Ser: 0.72 mg/dL (ref 0.50–1.10)
Glucose, Bld: 103 mg/dL — ABNORMAL HIGH (ref 70–99)
Potassium: 3.6 mmol/L (ref 3.5–5.1)
Sodium: 137 mmol/L (ref 135–145)

## 2014-11-18 LAB — URINE CULTURE: Special Requests: NORMAL

## 2014-11-26 ENCOUNTER — Other Ambulatory Visit: Payer: Medicaid Other

## 2014-12-08 ENCOUNTER — Emergency Department (HOSPITAL_COMMUNITY)
Admission: EM | Admit: 2014-12-08 | Discharge: 2014-12-08 | Discharge status: Against Medical Advice | Payer: Medicaid Other | Attending: Emergency Medicine | Admitting: Emergency Medicine

## 2014-12-08 ENCOUNTER — Encounter (HOSPITAL_COMMUNITY): Payer: Self-pay | Admitting: Emergency Medicine

## 2014-12-08 DIAGNOSIS — Z3201 Encounter for pregnancy test, result positive: Secondary | ICD-10-CM | POA: Diagnosis present

## 2014-12-08 DIAGNOSIS — E119 Type 2 diabetes mellitus without complications: Secondary | ICD-10-CM | POA: Insufficient documentation

## 2014-12-08 DIAGNOSIS — H9190 Unspecified hearing loss, unspecified ear: Secondary | ICD-10-CM | POA: Diagnosis not present

## 2014-12-08 DIAGNOSIS — J449 Chronic obstructive pulmonary disease, unspecified: Secondary | ICD-10-CM | POA: Diagnosis not present

## 2014-12-08 DIAGNOSIS — I509 Heart failure, unspecified: Secondary | ICD-10-CM | POA: Diagnosis not present

## 2014-12-08 DIAGNOSIS — Z720 Tobacco use: Secondary | ICD-10-CM | POA: Diagnosis not present

## 2014-12-08 DIAGNOSIS — I10 Essential (primary) hypertension: Secondary | ICD-10-CM | POA: Insufficient documentation

## 2014-12-08 LAB — URINALYSIS, ROUTINE W REFLEX MICROSCOPIC
Bilirubin Urine: NEGATIVE
GLUCOSE, UA: NEGATIVE mg/dL
Hgb urine dipstick: NEGATIVE
Ketones, ur: NEGATIVE mg/dL
LEUKOCYTES UA: NEGATIVE
NITRITE: NEGATIVE
PH: 7.5 (ref 5.0–8.0)
Protein, ur: NEGATIVE mg/dL
SPECIFIC GRAVITY, URINE: 1.021 (ref 1.005–1.030)
Urobilinogen, UA: 0.2 mg/dL (ref 0.0–1.0)

## 2014-12-08 LAB — POC URINE PREG, ED: Preg Test, Ur: POSITIVE — AB

## 2014-12-08 NOTE — ED Notes (Signed)
Called for pt in lobby x 3 - no answer

## 2014-12-08 NOTE — ED Notes (Signed)
Called for pt in lobby again, no answer.

## 2014-12-08 NOTE — ED Notes (Signed)
Pt requesting pregnancy test; pt sts LMP was last month

## 2014-12-09 ENCOUNTER — Other Ambulatory Visit: Payer: Self-pay

## 2015-03-23 ENCOUNTER — Encounter (HOSPITAL_COMMUNITY): Payer: Self-pay | Admitting: Cardiology

## 2015-03-23 ENCOUNTER — Emergency Department (HOSPITAL_COMMUNITY)
Admission: EM | Admit: 2015-03-23 | Discharge: 2015-03-23 | Disposition: A | Discharge status: Home / Self Care | Payer: Medicaid Other | Attending: Emergency Medicine | Admitting: Emergency Medicine

## 2015-03-23 ENCOUNTER — Emergency Department (HOSPITAL_COMMUNITY): Payer: Medicaid Other

## 2015-03-23 DIAGNOSIS — Z91040 Latex allergy status: Secondary | ICD-10-CM | POA: Insufficient documentation

## 2015-03-23 DIAGNOSIS — I509 Heart failure, unspecified: Secondary | ICD-10-CM | POA: Diagnosis not present

## 2015-03-23 DIAGNOSIS — Z7982 Long term (current) use of aspirin: Secondary | ICD-10-CM | POA: Insufficient documentation

## 2015-03-23 DIAGNOSIS — Z859 Personal history of malignant neoplasm, unspecified: Secondary | ICD-10-CM | POA: Insufficient documentation

## 2015-03-23 DIAGNOSIS — Z590 Homelessness: Secondary | ICD-10-CM | POA: Diagnosis not present

## 2015-03-23 DIAGNOSIS — I10 Essential (primary) hypertension: Secondary | ICD-10-CM | POA: Diagnosis not present

## 2015-03-23 DIAGNOSIS — F419 Anxiety disorder, unspecified: Secondary | ICD-10-CM | POA: Insufficient documentation

## 2015-03-23 DIAGNOSIS — R079 Chest pain, unspecified: Secondary | ICD-10-CM | POA: Diagnosis present

## 2015-03-23 DIAGNOSIS — E119 Type 2 diabetes mellitus without complications: Secondary | ICD-10-CM | POA: Diagnosis not present

## 2015-03-23 DIAGNOSIS — E669 Obesity, unspecified: Secondary | ICD-10-CM | POA: Diagnosis not present

## 2015-03-23 DIAGNOSIS — E780 Pure hypercholesterolemia: Secondary | ICD-10-CM | POA: Diagnosis not present

## 2015-03-23 DIAGNOSIS — H9190 Unspecified hearing loss, unspecified ear: Secondary | ICD-10-CM | POA: Diagnosis not present

## 2015-03-23 DIAGNOSIS — Z720 Tobacco use: Secondary | ICD-10-CM | POA: Diagnosis not present

## 2015-03-23 DIAGNOSIS — Z7902 Long term (current) use of antithrombotics/antiplatelets: Secondary | ICD-10-CM | POA: Insufficient documentation

## 2015-03-23 DIAGNOSIS — R000 Tachycardia, unspecified: Secondary | ICD-10-CM | POA: Insufficient documentation

## 2015-03-23 DIAGNOSIS — I2510 Atherosclerotic heart disease of native coronary artery without angina pectoris: Secondary | ICD-10-CM | POA: Diagnosis not present

## 2015-03-23 DIAGNOSIS — Z794 Long term (current) use of insulin: Secondary | ICD-10-CM | POA: Diagnosis not present

## 2015-03-23 DIAGNOSIS — Z638 Other specified problems related to primary support group: Secondary | ICD-10-CM

## 2015-03-23 DIAGNOSIS — H548 Legal blindness, as defined in USA: Secondary | ICD-10-CM | POA: Diagnosis not present

## 2015-03-23 DIAGNOSIS — G40909 Epilepsy, unspecified, not intractable, without status epilepticus: Secondary | ICD-10-CM | POA: Diagnosis not present

## 2015-03-23 DIAGNOSIS — Z6371 Stress on family due to return of family member from military deployment: Secondary | ICD-10-CM | POA: Insufficient documentation

## 2015-03-23 DIAGNOSIS — J449 Chronic obstructive pulmonary disease, unspecified: Secondary | ICD-10-CM | POA: Insufficient documentation

## 2015-03-23 DIAGNOSIS — R51 Headache: Secondary | ICD-10-CM | POA: Insufficient documentation

## 2015-03-23 DIAGNOSIS — Z79899 Other long term (current) drug therapy: Secondary | ICD-10-CM | POA: Insufficient documentation

## 2015-03-23 DIAGNOSIS — Z880 Allergy status to penicillin: Secondary | ICD-10-CM | POA: Insufficient documentation

## 2015-03-23 DIAGNOSIS — R519 Headache, unspecified: Secondary | ICD-10-CM

## 2015-03-23 HISTORY — DX: Legal blindness, as defined in USA: H54.8

## 2015-03-23 LAB — CBC WITH DIFFERENTIAL/PLATELET
Basophils Absolute: 0 10*3/uL (ref 0.0–0.1)
Basophils Relative: 0 % (ref 0–1)
Eosinophils Absolute: 0.1 10*3/uL (ref 0.0–0.7)
Eosinophils Relative: 1 % (ref 0–5)
HEMATOCRIT: 42.4 % (ref 36.0–46.0)
Hemoglobin: 14.7 g/dL (ref 12.0–15.0)
Lymphocytes Relative: 18 % (ref 12–46)
Lymphs Abs: 1.8 10*3/uL (ref 0.7–4.0)
MCH: 29.5 pg (ref 26.0–34.0)
MCHC: 34.7 g/dL (ref 30.0–36.0)
MCV: 85 fL (ref 78.0–100.0)
MONOS PCT: 6 % (ref 3–12)
Monocytes Absolute: 0.6 10*3/uL (ref 0.1–1.0)
NEUTROS ABS: 7.5 10*3/uL (ref 1.7–7.7)
Neutrophils Relative %: 75 % (ref 43–77)
Platelets: 220 10*3/uL (ref 150–400)
RBC: 4.99 MIL/uL (ref 3.87–5.11)
RDW: 13.9 % (ref 11.5–15.5)
WBC: 10 10*3/uL (ref 4.0–10.5)

## 2015-03-23 LAB — BASIC METABOLIC PANEL
Anion gap: 13 (ref 5–15)
BUN: 12 mg/dL (ref 6–20)
CALCIUM: 8.6 mg/dL — AB (ref 8.9–10.3)
CHLORIDE: 101 mmol/L (ref 101–111)
CO2: 24 mmol/L (ref 22–32)
CREATININE: 0.65 mg/dL (ref 0.44–1.00)
GFR calc Af Amer: >60 mL/min (ref >60)
GLUCOSE: 126 mg/dL — AB (ref 65–99)
Potassium: 3.2 mmol/L — ABNORMAL LOW (ref 3.5–5.1)
SODIUM: 138 mmol/L (ref 135–145)

## 2015-03-23 LAB — I-STAT TROPONIN, ED
TROPONIN I, POC: 0.03 ng/mL (ref 0.00–0.08)
Troponin i, poc: 0.03 ng/mL (ref 0.00–0.08)

## 2015-03-23 LAB — D-DIMER, QUANTITATIVE: D-Dimer, Quant: 0.48 ug/mL-FEU (ref 0.00–0.48)

## 2015-03-23 MED ORDER — METOCLOPRAMIDE HCL 5 MG/ML IJ SOLN
10.0000 mg | Freq: Once | INTRAMUSCULAR | Status: AC
  Administered 2015-03-23: 10 mg via INTRAVENOUS
  Filled 2015-03-23: qty 2

## 2015-03-23 MED ORDER — ASPIRIN 81 MG PO CHEW
324.0000 mg | CHEWABLE_TABLET | Freq: Once | ORAL | Status: AC
  Administered 2015-03-23: 324 mg via ORAL
  Filled 2015-03-23: qty 4

## 2015-03-23 MED ORDER — LORAZEPAM 1 MG PO TABS
1.0000 mg | ORAL_TABLET | Freq: Once | ORAL | Status: AC
  Administered 2015-03-23: 1 mg via ORAL
  Filled 2015-03-23: qty 1

## 2015-03-23 MED ORDER — METOPROLOL TARTRATE 1 MG/ML IV SOLN
10.0000 mg | Freq: Once | INTRAVENOUS | Status: AC
  Administered 2015-03-23: 10 mg via INTRAVENOUS
  Filled 2015-03-23: qty 10

## 2015-03-23 MED ORDER — DIPHENHYDRAMINE HCL 50 MG/ML IJ SOLN
12.5000 mg | Freq: Once | INTRAMUSCULAR | Status: AC
  Administered 2015-03-23: 12.5 mg via INTRAVENOUS
  Filled 2015-03-23: qty 1

## 2015-03-23 MED ORDER — SODIUM CHLORIDE 0.9 % IV BOLUS (SEPSIS)
1000.0000 mL | Freq: Once | INTRAVENOUS | Status: AC
  Administered 2015-03-23: 1000 mL via INTRAVENOUS

## 2015-03-23 NOTE — ED Notes (Signed)
Spoke to social work- will speak to pt when available.
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
## 2015-03-23 NOTE — ED Notes (Signed)
Patient now states last name is not correct name. Registration made aware.

## 2015-03-23 NOTE — Discharge Instructions (Signed)
°Emergency Department Resource Guide °1) Find a Doctor and Pay Out of Pocket °Although you won't have to find out who is covered by your insurance plan, it is a good idea to ask around and get recommendations. You will then need to call the office and see if the doctor you have chosen will accept you as a new patient and what types of options they offer for patients who are self-pay. Some doctors offer discounts or will set up payment plans for their patients who do not have insurance, but you will need to ask so you aren't surprised when you get to your appointment. ° °2) Contact Your Local Health Department °Not all health departments have doctors that can see patients for sick visits, but many do, so it is worth a call to see if yours does. If you don't know where your local health department is, you can check in your phone book. The CDC also has a tool to help you locate your state's health department, and many state websites also have listings of all of their local health departments. ° °3) Find a Walk-in Clinic °If your illness is not likely to be very severe or complicated, you may want to try a walk in clinic. These are popping up all over the country in pharmacies, drugstores, and shopping centers. They're usually staffed by nurse practitioners or physician assistants that have been trained to treat common illnesses and complaints. They're usually fairly quick and inexpensive. However, if you have serious medical issues or chronic medical problems, these are probably not your best option. ° °No Primary Care Doctor: °- Call [facility] at  832-8000 - they can help you locate a primary care doctor that  accepts your insurance, provides certain services, etc. °- Physician Referral Service- 1-800-533-3463 ° °Chronic Pain Problems: °Organization         Address  Phone   Notes  °[facility]  (336) [phone] Patients need to be referred by their primary care doctor.  ° °Medication  Assistance: °Organization         Address  Phone   Notes  °[place] [facility] [address] °[place], [place] [address] (336) 641-8030 --Must be a resident of [place] °-- Must have NO insurance coverage whatsoever (no Medicaid/ Medicare, etc.) °-- The pt. MUST have a primary care doctor that directs their care regularly and follows them in the community °  °[facility]  (866) 331-1348   °[employer]  (888) 892-1162   ° °Agencies that provide inexpensive medical care: °Organization         Address  Phone   Notes  °[facility]  (336) 832-8035   °[facility]    (336) 832-7272   °[facility] [address] °[place], [place] [address] (336) 832-4777   °[facility] [address], °[place] (336) 271-4999   °[facility]    (336) 373-0678   °[facility]    (336) 272-1050   °[facility] ° [address], [place] Phone:  (336) 832-4444, Fax:  (336) 832-4440 Hours of Operation:  9 am - 6 pm, M-F.  Also accepts Medicaid/Medicare and self-pay.  °[facility] ° [address],  Phone: (336) 832-3150, Fax: (336) 832-3151. Hours of Operation:  8:30 am - 5:30 pm, M-F.  Also accepts Medicaid and self-pay.  °[facility] [place] [address], [place] Phone: (336) 878-6027   °[facility] [address], [place], [place] (336)723-1848, Ext. 123 Mondays & Thursdays: 7-9 AM.  First 15 patients are seen on a first come, first serve basis. °  ° °Medicaid-accepting [place] Providers: ° °Organization         Address  Phone   Notes  °[facility] [address], [place] (336) 641-2100 Also accepts self-pay patients.  °[facility] [address], [place] ° (336) 856-9996   °[facility] [address], [place]  (336) 288-8857   °[facility] [address] [place] [address], [place] (336) 299-7000   °Veita Bland [address], [place]  ° (336) 373-1557 Only accepts [place] Access Medicaid patients after they have their name applied to their card.  ° °Self-Pay (no insurance) in [place]: ° °Organization         Address  Phone   Notes  °Sickle Cell Patients, [facility] [address], [place] (336) 832-1970   °[facility] [address], [place] (336) 832-4400   °[facility] ° [address], [place] (336) 992-4800   °[facility]/Dr. Osei-Bonsu ° [address] [place] [address], [place] or [address], [place] (336) 841-8500 Phone number for both [place] and [place] locations is the same.  °[facility] [address], [place] (336) 299-0000   °[facility] [address] [place] [address], [place] or [address] (336) 852-7530 °(336) 878-2260   °[facility] [address], [place] (336) 350-1642, phone; (336) 294-5005, fax Sees patients 1st and 3rd Saturday of every month.  Must not qualify for public or private insurance (i.e. Medicaid, Medicare, [place] Health Choice, Veterans' Benefits) • Household income should be no more than 200% of the poverty level •The clinic cannot treat you if you are pregnant or think you are pregnant • Sexually transmitted diseases are not treated at the clinic.  ° ° °Dental Care: °Organization         Address  Phone  Notes  °[place] [facility] [address], [place] (336) 641-6152 Accepts children up to age 21 who are enrolled in Medicaid or [place] Health Choice; pregnant women with a Medicaid card; and children who have applied for Medicaid or [place] Health Choice, but were declined, whose parents can pay a reduced fee at time of service.  °[place]  [facility] [place]  [address], [place] (336) 641-7733 Accepts children up to age 21 who are enrolled in Medicaid or [place] Health Choice; pregnant women with a Medicaid card; and children who have applied for Medicaid or  Health Choice, but were declined, whose parents can pay a reduced fee at time of service.  °[facility] ° [address],  (336) 641-4533 Patients are seen by appointment only. Walk-ins are not accepted. [facility] will see patients 18 years of age and older. °Monday - Tuesday (8am-5pm) °Most Wednesdays (8:30-5pm) °$30 per visit, cash only  °[facility] ° [address], [place] (336) 641-4533 Patients are seen by appointment only. Walk-ins are not accepted. [facility] will see patients 18 years of age and older. °One   Wednesday Evening (Monthly: Volunteer Based).  $30 per visit, cash only  °[facility]  (919) 537-3737 for adults; Children under age 4, call Graduate Pediatric Dentistry at ([phone]) [phone]. Children aged 4-14, please call ([phone]) [phone] to request a pediatric application. ° Dental services are provided in all areas of dental care including fillings, crowns and bridges, complete and partial dentures, implants, gum treatment, root canals, and extractions. Preventive care is also provided. Treatment is provided to both adults and children. °Patients are selected via a lottery and there is often a waiting list. °  °[facility] [address], °[place] ° (336) 763-8833 [email or web address] °  °[facility] [address], [place], [place] (336)723-1848, Ext. 123 Second and Fourth Thursday of each month, opens at 6:30 AM; Clinic ends at 9 AM.  Patients are seen on a first-come first-served basis, and a limited number are seen during each clinic.  ° °[facility] ° [address], [place], [place] (336) 723-7904    Eligibility Requirements °You must have lived in [place], [place], or [place] counties for at least the last three months. °  You cannot be eligible for state or federal sponsored healthcare insurance, including Veterans Administration, Medicaid, or Medicare. °  You generally cannot be eligible for healthcare insurance through your employer.  °  How to apply: °Eligibility screenings are held every Tuesday and Wednesday afternoon from 1:00 pm until 4:00 pm. You do not need an appointment for the interview!  °[facility] [address], [place], [place] 336-631-2330   °[facility]  336-342-8273   °[facility]  336-703-3100   °[facility]  336-570-6415   ° °Behavioral Health Resources in the Community: °Intensive Outpatient Programs °Organization         Address  Phone  Notes  °[facility] [address], [place], [place] 336-878-6098   °[facility] [address], [place], [place] 336-832-9800   °[facility] [address], [place], [place] ° 336-882-2125   °[facility] [address],  °[place], [place] 1-800-853-5163 or 336-641-4981   °Substance Abuse Resources °Organization         Address  Phone  Notes  °[facility]  336-882-2125   °[facility]  336-784-9470   °[facility]  336-285-9073   °[facility]  336-845-3988   °Residential & Outpatient Substance Abuse Program  1-800-659-3381   °Psychological Services °Organization         Address  Phone  Notes  °[facility]  336- 832-9600   °[facility]  336- 378-7881   °[facility] [address], [place] 1-800-853-5163 or 336-641-4981   ° °Mobile Crisis Teams °Organization         Address  Phone  Notes  °[facility], Mobile Crisis Care Unit  1-877-626-1772   °Assertive °[facility] ° [address]  [place], [place] 336-834-9664   °Sharon DeEsch [address] °[place]  336-554-5454   ° °Self-Help/Support Groups °Organization         Address  Phone             Notes  °[facility] - variety of support groups  336- 373-1402 Call for more information  °Narcotics Anonymous (NA), [facility] [address], °[place]   2 meetings at this location  ° °  Residential Treatment Programs °Organization         Address  Phone  Notes  °[facility] [address],    °[place]  1-866-801-8205   °[facility] ° [address], [place], [place] 704-293-8524   °[facility] [address], [place] 336-845-3988 Admissions: 8am-3pm M-F  °[facility] [address]    °[place], [place] 336-841-1104   °[facility] [address], [place], [place] 336-379-7146   °[facility] [address]  °[place], [place] 336-285-9073   °[facility] Intensive Outpatient [address], [place], [place] 336-852-3033   °[facility] [address]  °[place], [place] 1-877-615-2722 or 336-784-9470   °[facility] [address] [place], [place] 336-227-7417 Accepts Medicaid  °[facility] [address]  °[place] 1-800-659-3381 Substance Abuse/Addiction Treatment  ° °[place] Behavioral Health Resources °Organization         Address  Phone  Notes  °[facility]  (888) 581-9988   °Julie Brannon, PhD [address] [place], [place]   (336) 349-5553 or (336) 951-0000   °[facility]   [address] °[place], [place] (336) 349-4454   °[facility] [address], [place], [place] (336) 342-8316 Insurance/Medicaid/sponsorship through [facility]  °[facility] [address]                                    [place], [place] (336) 342-8316 Therapy/tele-psych/case    °[facility] [address]  ° [place], [place] (336) 349-2233    °Dr. Arfeen  (336) 349-4544   °[facility] [place]  [employer] [place] [facility] 1) [address], [place] °2) [address], [place] °3)  [address], [place] (336) 349-3220 °(336) 342-7768 ° °(336) 342-8140   °[place] [facility] (336) 342-1394 or (336) 342-3537 (After Hours)    ° ° °
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
## 2015-03-23 NOTE — ED Notes (Signed)
RN left message with social work.

## 2015-03-23 NOTE — ED Notes (Signed)
Social work at bedside.  
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
## 2015-06-21 ENCOUNTER — Emergency Department (HOSPITAL_COMMUNITY): Payer: Medicaid Other

## 2015-06-21 ENCOUNTER — Inpatient Hospital Stay (HOSPITAL_COMMUNITY)
Admission: EM | Admit: 2015-06-21 | Discharge: 2015-06-24 | DRG: 291 | Disposition: A | Discharge status: Home / Self Care | Payer: Medicaid Other | Attending: Internal Medicine | Admitting: Internal Medicine

## 2015-06-21 ENCOUNTER — Encounter (HOSPITAL_COMMUNITY): Payer: Self-pay | Admitting: Radiology

## 2015-06-21 DIAGNOSIS — H548 Legal blindness, as defined in USA: Secondary | ICD-10-CM | POA: Diagnosis present

## 2015-06-21 DIAGNOSIS — I10 Essential (primary) hypertension: Secondary | ICD-10-CM

## 2015-06-21 DIAGNOSIS — E119 Type 2 diabetes mellitus without complications: Secondary | ICD-10-CM | POA: Diagnosis present

## 2015-06-21 DIAGNOSIS — Z9049 Acquired absence of other specified parts of digestive tract: Secondary | ICD-10-CM | POA: Diagnosis not present

## 2015-06-21 DIAGNOSIS — H9190 Unspecified hearing loss, unspecified ear: Secondary | ICD-10-CM | POA: Diagnosis present

## 2015-06-21 DIAGNOSIS — E669 Obesity, unspecified: Secondary | ICD-10-CM | POA: Diagnosis present

## 2015-06-21 DIAGNOSIS — J449 Chronic obstructive pulmonary disease, unspecified: Secondary | ICD-10-CM | POA: Diagnosis present

## 2015-06-21 DIAGNOSIS — R569 Unspecified convulsions: Secondary | ICD-10-CM | POA: Diagnosis present

## 2015-06-21 DIAGNOSIS — E785 Hyperlipidemia, unspecified: Secondary | ICD-10-CM | POA: Diagnosis present

## 2015-06-21 DIAGNOSIS — F17210 Nicotine dependence, cigarettes, uncomplicated: Secondary | ICD-10-CM | POA: Diagnosis present

## 2015-06-21 DIAGNOSIS — J189 Pneumonia, unspecified organism: Secondary | ICD-10-CM | POA: Diagnosis not present

## 2015-06-21 DIAGNOSIS — Z8673 Personal history of transient ischemic attack (TIA), and cerebral infarction without residual deficits: Secondary | ICD-10-CM

## 2015-06-21 DIAGNOSIS — Z6841 Body Mass Index (BMI) 40.0 and over, adult: Secondary | ICD-10-CM | POA: Diagnosis not present

## 2015-06-21 DIAGNOSIS — I5033 Acute on chronic diastolic (congestive) heart failure: Secondary | ICD-10-CM | POA: Diagnosis not present

## 2015-06-21 DIAGNOSIS — Z79899 Other long term (current) drug therapy: Secondary | ICD-10-CM

## 2015-06-21 DIAGNOSIS — F419 Anxiety disorder, unspecified: Secondary | ICD-10-CM | POA: Diagnosis present

## 2015-06-21 DIAGNOSIS — I110 Hypertensive heart disease with heart failure: Secondary | ICD-10-CM | POA: Diagnosis present

## 2015-06-21 DIAGNOSIS — F329 Major depressive disorder, single episode, unspecified: Secondary | ICD-10-CM | POA: Diagnosis present

## 2015-06-21 DIAGNOSIS — R0600 Dyspnea, unspecified: Secondary | ICD-10-CM | POA: Diagnosis not present

## 2015-06-21 DIAGNOSIS — R0602 Shortness of breath: Secondary | ICD-10-CM | POA: Insufficient documentation

## 2015-06-21 DIAGNOSIS — E1159 Type 2 diabetes mellitus with other circulatory complications: Secondary | ICD-10-CM | POA: Diagnosis not present

## 2015-06-21 LAB — CBC WITH DIFFERENTIAL/PLATELET
Basophils Absolute: 0 10*3/uL (ref 0.0–0.1)
Basophils Relative: 0 %
Eosinophils Absolute: 0.2 10*3/uL (ref 0.0–0.7)
Eosinophils Relative: 1 %
HCT: 41.5 % (ref 36.0–46.0)
Hemoglobin: 14.1 g/dL (ref 12.0–15.0)
Lymphocytes Relative: 18 %
Lymphs Abs: 2.2 10*3/uL (ref 0.7–4.0)
MCH: 29.4 pg (ref 26.0–34.0)
MCHC: 34 g/dL (ref 30.0–36.0)
MCV: 86.6 fL (ref 78.0–100.0)
Monocytes Absolute: 0.8 10*3/uL (ref 0.1–1.0)
Monocytes Relative: 6 %
Neutro Abs: 9.1 10*3/uL — ABNORMAL HIGH (ref 1.7–7.7)
Neutrophils Relative %: 75 %
Platelets: 270 10*3/uL (ref 150–400)
RBC: 4.79 MIL/uL (ref 3.87–5.11)
RDW: 14.2 % (ref 11.5–15.5)
WBC: 12.2 10*3/uL — ABNORMAL HIGH (ref 4.0–10.5)

## 2015-06-21 LAB — COMPREHENSIVE METABOLIC PANEL
ALT: 38 U/L (ref 14–54)
AST: 42 U/L — ABNORMAL HIGH (ref 15–41)
Albumin: 3.5 g/dL (ref 3.5–5.0)
Alkaline Phosphatase: 77 U/L (ref 38–126)
Anion gap: 10 (ref 5–15)
BUN: 14 mg/dL (ref 6–20)
CO2: 24 mmol/L (ref 22–32)
Calcium: 8.6 mg/dL — ABNORMAL LOW (ref 8.9–10.3)
Chloride: 102 mmol/L (ref 101–111)
Creatinine, Ser: 0.76 mg/dL (ref 0.44–1.00)
GFR calc Af Amer: >60 mL/min (ref >60)
GFR calc non Af Amer: >60 mL/min (ref >60)
Glucose, Bld: 119 mg/dL — ABNORMAL HIGH (ref 65–99)
Potassium: 3.9 mmol/L (ref 3.5–5.1)
Sodium: 136 mmol/L (ref 135–145)
Total Bilirubin: 0.9 mg/dL (ref 0.3–1.2)
Total Protein: 6.6 g/dL (ref 6.5–8.1)

## 2015-06-21 LAB — URINALYSIS, ROUTINE W REFLEX MICROSCOPIC
Bilirubin Urine: NEGATIVE
Glucose, UA: NEGATIVE mg/dL
Hgb urine dipstick: NEGATIVE
Ketones, ur: NEGATIVE mg/dL
Leukocytes, UA: NEGATIVE
Nitrite: NEGATIVE
Protein, ur: >300 mg/dL — AB
Specific Gravity, Urine: >1.046 — ABNORMAL HIGH (ref 1.005–1.030)
Urobilinogen, UA: 0.2 mg/dL (ref 0.0–1.0)
pH: 5.5 (ref 5.0–8.0)

## 2015-06-21 LAB — URINE MICROSCOPIC-ADD ON

## 2015-06-21 LAB — CBG MONITORING, ED: Glucose-Capillary: 153 mg/dL — ABNORMAL HIGH (ref 65–99)

## 2015-06-21 MED ORDER — IOHEXOL 300 MG/ML  SOLN
100.0000 mL | Freq: Once | INTRAMUSCULAR | Status: AC | PRN
  Administered 2015-06-21: 100 mL via INTRAVENOUS

## 2015-06-21 MED ORDER — HYDROMORPHONE HCL 1 MG/ML IJ SOLN
1.0000 mg | Freq: Once | INTRAMUSCULAR | Status: AC
  Administered 2015-06-21: 1 mg via INTRAVENOUS
  Filled 2015-06-21: qty 1

## 2015-06-21 MED ORDER — INSULIN ASPART PROT & ASPART (70-30 MIX) 100 UNIT/ML ~~LOC~~ SUSP
15.0000 [IU] | Freq: Two times a day (BID) | SUBCUTANEOUS | Status: DC
  Administered 2015-06-22 – 2015-06-24 (×5): 15 [IU] via SUBCUTANEOUS
  Filled 2015-06-21: qty 10

## 2015-06-21 MED ORDER — FUROSEMIDE 10 MG/ML IJ SOLN
40.0000 mg | Freq: Once | INTRAMUSCULAR | Status: AC
  Administered 2015-06-21: 40 mg via INTRAVENOUS
  Filled 2015-06-21: qty 4

## 2015-06-21 MED ORDER — ENOXAPARIN SODIUM 80 MG/0.8ML ~~LOC~~ SOLN
65.0000 mg | Freq: Every day | SUBCUTANEOUS | Status: DC
Start: 2015-06-21 — End: 2015-06-24
  Administered 2015-06-21 – 2015-06-23 (×3): 65 mg via SUBCUTANEOUS
  Filled 2015-06-21 (×4): qty 0.8

## 2015-06-21 MED ORDER — IOHEXOL 350 MG/ML SOLN
75.0000 mL | Freq: Once | INTRAVENOUS | Status: AC | PRN
  Administered 2015-06-21: 75 mL via INTRAVENOUS

## 2015-06-21 MED ORDER — LEVETIRACETAM 750 MG PO TABS
1500.0000 mg | ORAL_TABLET | Freq: Two times a day (BID) | ORAL | Status: DC
  Administered 2015-06-21 – 2015-06-24 (×6): 1500 mg via ORAL
  Filled 2015-06-21 (×7): qty 2

## 2015-06-21 MED ORDER — FENTANYL CITRATE (PF) 100 MCG/2ML IJ SOLN
50.0000 ug | Freq: Once | INTRAMUSCULAR | Status: AC
  Administered 2015-06-21: 50 ug via INTRAVENOUS
  Filled 2015-06-21: qty 2

## 2015-06-21 MED ORDER — ALBUTEROL SULFATE (2.5 MG/3ML) 0.083% IN NEBU: 2.5000 mg | INHALATION_SOLUTION | Freq: Four times a day (QID) | RESPIRATORY_TRACT | Status: DC | PRN

## 2015-06-21 MED ORDER — IOHEXOL 300 MG/ML  SOLN
50.0000 mL | Freq: Once | INTRAMUSCULAR | Status: AC | PRN
  Administered 2015-06-21: 50 mL via ORAL

## 2015-06-21 MED ORDER — INSULIN ASPART 100 UNIT/ML ~~LOC~~ SOLN
0.0000 [IU] | Freq: Three times a day (TID) | SUBCUTANEOUS | Status: DC
  Administered 2015-06-22: 2 [IU] via SUBCUTANEOUS
  Administered 2015-06-22: 1 [IU] via SUBCUTANEOUS
  Administered 2015-06-23: 2 [IU] via SUBCUTANEOUS
  Administered 2015-06-23: 1 [IU] via SUBCUTANEOUS

## 2015-06-21 MED ORDER — CETYLPYRIDINIUM CHLORIDE 0.05 % MT LIQD
7.0000 mL | Freq: Two times a day (BID) | OROMUCOSAL | Status: DC
  Administered 2015-06-22 – 2015-06-23 (×2): 7 mL via OROMUCOSAL

## 2015-06-21 MED ORDER — LEVOFLOXACIN IN D5W 750 MG/150ML IV SOLN
750.0000 mg | Freq: Once | INTRAVENOUS | Status: AC
  Administered 2015-06-21: 750 mg via INTRAVENOUS
  Filled 2015-06-21: qty 150

## 2015-06-21 MED ORDER — BUDESONIDE-FORMOTEROL FUMARATE 160-4.5 MCG/ACT IN AERO
2.0000 | INHALATION_SPRAY | Freq: Two times a day (BID) | RESPIRATORY_TRACT | Status: DC
  Filled 2015-06-21: qty 6

## 2015-06-21 MED ORDER — ALPRAZOLAM 1 MG PO TABS
2.0000 mg | ORAL_TABLET | Freq: Three times a day (TID) | ORAL | Status: DC
  Administered 2015-06-21: 2 mg via ORAL
  Filled 2015-06-21: qty 2

## 2015-06-21 MED ORDER — ACETAMINOPHEN 325 MG PO TABS
650.0000 mg | ORAL_TABLET | Freq: Four times a day (QID) | ORAL | Status: DC | PRN
  Filled 2015-06-21 (×2): qty 2

## 2015-06-21 MED ORDER — CLOPIDOGREL BISULFATE 75 MG PO TABS
75.0000 mg | ORAL_TABLET | Freq: Every day | ORAL | Status: DC
  Administered 2015-06-21 – 2015-06-24 (×4): 75 mg via ORAL
  Filled 2015-06-21 (×4): qty 1

## 2015-06-21 MED ORDER — MORPHINE SULFATE (PF) 2 MG/ML IV SOLN: 0.5000 mg | Freq: Once | INTRAVENOUS | Status: DC

## 2015-06-21 MED ORDER — NITROGLYCERIN 0.4 MG SL SUBL: 0.4000 mg | SUBLINGUAL_TABLET | SUBLINGUAL | Status: DC | PRN

## 2015-06-21 MED ORDER — ISOSORBIDE MONONITRATE ER 30 MG PO TB24
30.0000 mg | ORAL_TABLET | Freq: Every day | ORAL | Status: DC
  Administered 2015-06-22 – 2015-06-24 (×3): 30 mg via ORAL
  Filled 2015-06-21 (×3): qty 1

## 2015-06-21 MED ORDER — PHENYTOIN SODIUM EXTENDED 100 MG PO CAPS
300.0000 mg | ORAL_CAPSULE | Freq: Every day | ORAL | Status: DC
  Administered 2015-06-21 – 2015-06-23 (×3): 300 mg via ORAL
  Filled 2015-06-21 (×4): qty 3

## 2015-06-21 MED ORDER — ASPIRIN EC 81 MG PO TBEC
81.0000 mg | DELAYED_RELEASE_TABLET | Freq: Every morning | ORAL | Status: DC
  Administered 2015-06-22 – 2015-06-24 (×3): 81 mg via ORAL
  Filled 2015-06-21 (×3): qty 1

## 2015-06-21 MED ORDER — ISOSORBIDE DINITRATE 20 MG PO TABS
20.0000 mg | ORAL_TABLET | Freq: Every day | ORAL | Status: DC
Start: 2015-06-21 — End: 2015-06-21

## 2015-06-21 MED ORDER — CHLORHEXIDINE GLUCONATE 0.12 % MT SOLN
15.0000 mL | Freq: Two times a day (BID) | OROMUCOSAL | Status: DC
  Administered 2015-06-21 – 2015-06-23 (×5): 15 mL via OROMUCOSAL
  Filled 2015-06-21 (×7): qty 15

## 2015-06-21 MED ORDER — RISPERIDONE 2 MG PO TABS
2.0000 mg | ORAL_TABLET | Freq: Every day | ORAL | Status: DC
  Administered 2015-06-21 – 2015-06-23 (×3): 2 mg via ORAL
  Filled 2015-06-21 (×4): qty 1

## 2015-06-21 MED ORDER — LISINOPRIL 10 MG PO TABS
10.0000 mg | ORAL_TABLET | Freq: Every day | ORAL | Status: DC
  Filled 2015-06-21: qty 1

## 2015-06-21 MED ORDER — ALBUTEROL SULFATE HFA 108 (90 BASE) MCG/ACT IN AERS: 4.0000 | INHALATION_SPRAY | Freq: Four times a day (QID) | RESPIRATORY_TRACT | Status: DC | PRN

## 2015-06-21 MED ORDER — LEVOFLOXACIN IN D5W 750 MG/150ML IV SOLN
750.0000 mg | INTRAVENOUS | Status: DC
  Administered 2015-06-22: 750 mg via INTRAVENOUS
  Filled 2015-06-21: qty 150

## 2015-06-21 MED ORDER — SIMVASTATIN 20 MG PO TABS
20.0000 mg | ORAL_TABLET | Freq: Every day | ORAL | Status: DC
  Administered 2015-06-21 – 2015-06-23 (×3): 20 mg via ORAL
  Filled 2015-06-21 (×4): qty 1

## 2015-06-21 NOTE — ED Notes (Signed)
Pt arrived via EMS with her husband and her Service Dog, a large, black breed.  The paperwork for the service dog is from [place] and the dog does not have any identifying tags or clothing (service vest).  The dog was also observed jumping up on Pts husband and the walls of the ED.  RN spoke to Pt, Pts husband, Agricultural consultant and with [employer] on site.  Decision was made to require dog to leave the ED as there is no valid documentation of the dogs rabies vaccine.  RN explained this & Pt expressed understanding. Pts husband directed to the park behind the hospital with the dog as he does not have money to take the bus home.  [employer] counseled Pt as to process for getting service dog approved in [place].

## 2015-06-21 NOTE — ED Notes (Signed)
Pt from home.  Called EMS for [facility] that has gotten progressively worse for the last 2 days following congestion for the last week with a non productive cough.  Pt is not on home O2 at all.  She arrives via EMS at 8L via Non rebreather.  She received 5mg  Albuterol then a Duovent from EMS.  Sinus Tach on monitor around 110  BP 180/110  Pt also c/o HA.

## 2015-06-21 NOTE — Progress Notes (Signed)
Patient noted to have Medicaid insurance without a pcp.  EDCM spoke to patient at bedside.  Patient reports, "I have a doctor, I just can't get to him.  He's out in the [facility]."  Patient confirms she has [employer].  [facility] asked patient if she was aware of Medicaid transport?  Patient reports, "I just got that."  Patient unable to tell [facility] the name of her pcp. "It's listed on my card."  [facility] informed patient [facility] listed did not have a pcp listed on her card.  Patient reports she wants a new pcp.  EDCM provided patient with a pst of pcps who accept Medicaid insurance in [place].  [facility] informed patient that if she wanted to change the pcp on her insurance card, she would have to contact the DSS.  Patient verbalized understanding.  Patient reports he husband is with  Her but is outside with her therapy dog.  No further EDCM needs at this time.

## 2015-06-21 NOTE — H&P (Signed)
History and Physical  Kamorie Aldous MGQ:[id number] DOB: December 30, 1970 DOA: 06/21/2015  PCP: No PCP Per Patient   Chief Complaint: Dyspnea   History of Present Illness:  Patient is a 44 year old female with history of DM, TIA, CHF, CVA who presented with cc of dyspnea for the past two weeks. She has had cough productive of thick brownish sputum with chest pain in mid sternal area that is not exertional with fever and chills. She also has had headaches, nausea, and constipation with mild abdominal discomfort. She has had worsening orthopnea and PND over the last couple of months with worsening LE edema ( 3 > 6 pillow orthopnea). She became legally blind a few months ago.  Review of Systems:  CONSTITUTIONAL:  No night sweats.  No fatigue, malaise, lethargy.  No fever or chills. Eyes:  + visual changes.  No eye pain.  No eye discharge.   ENT:    No epistaxis.  No sinus pain.  No sore throat.  No ear pain.  No congestion. RESPIRATORY:  +cough.  No wheeze.  No hemoptysis.  +shortness of breath. CARDIOVASCULAR:  +chest pains.  No palpitations. GASTROINTESTINAL:  No abdominal pain.  +nausea or vomiting.  No diarrhea .+constipation.  No hematemesis.  No hematochezia.  No melena. GENITOURINARY:  No urgency.  No frequency.  No dysuria.  No hematuria.  No obstructive symptoms.  No discharge.  No pain.  No significant abnormal bleeding. MUSCULOSKELETAL:  No musculoskeletal pain.  No joint swelling.  No arthritis. NEUROLOGICAL:  No confusion.  No weakness. No headache. No seizure. PSYCHIATRIC:  No depression. No anxiety. No suicidal ideation. SKIN:  No rashes.  No lesions.  No wounds. ENDOCRINE:  No unexplained weight loss.  No polydipsia.  No polyuria.  No polyphagia. HEMATOLOGIC:  No anemia.  No purpura.  No petechiae.  No bleeding.  ALLERGIC AND IMMUNOLOGIC:  No pruritus.  No swelling Other:  Past Medical and Surgical History:   Past Medical History  Diagnosis Date  . Hypertension   .  Diabetes mellitus without complication ([place])   . COPD (chronic obstructive pulmonary disease) ([place])   . CHF (congestive heart failure) ([place])   . Seizures ([place])   . Cancer ([place])   . Homelessness   . Deaf   . Legally blind     bilaterally    Past Surgical History  Procedure Laterality Date  . Brain surgery    . Cholecystectomy    . Tubal ligation      Social History:   reports that she has been smoking Cigarettes.  She has a 34 pack-year smoking history. She has never used smokeless tobacco. She reports that she does not drink alcohol or use illicit drugs.   Allergies  Allergen Reactions  . Amoxicillin Anaphylaxis    Has patient had a PCN reaction causing immediate rash, facial/tongue/throat swelling, SOB or lightheadedness with hypotension: Yes Has patient had a PCN reaction causing severe rash involving mucus membranes or skin necrosis: Yes Has patient had a PCN reaction that required hospitalization Yes Has patient had a PCN reaction occurring within the last 10 years: No If all of the above answers are "NO", then may proceed with Cephalosporin use.   . Ibuprofen Anaphylaxis  . Latex Hives and Swelling  . Penicillins Anaphylaxis    Has patient had a PCN reaction causing immediate rash, facial/tongue/throat swelling, SOB or lightheadedness with hypotension: Yes Has patient had a PCN reaction causing severe rash involving mucus membranes or skin necrosis: Yes  Has patient had a PCN reaction that required hospitalization Yes Has patient had a PCN reaction occurring within the last 10 years: yes If all of the above answers are "NO", then may proceed with Cephalosporin use.   . Tramadol Anaphylaxis  . Azithromycin Hives  . Depakote [Divalproex Sodium] Other (See Comments)    hallucinations  . Lactose Intolerance ([place]) Nausea And Vomiting  . Latex Hives and Swelling  . Prednisone Hives  . Haldol [Haloperidol Lactate]     Leg swelling  . Ibuprofen Hives  . Prednisone Hives     History reviewed. No pertinent family history.    Prior to Admission medications   Medication Sig Start Date End Date Taking? Authorizing Provider  albuterol (PROVENTIL HFA;VENTOLIN HFA) 108 (90 BASE) MCG/ACT inhaler Inhale 4 puffs into the lungs every 6 (six) hours as needed for wheezing or shortness of breath.   Yes Historical Provider, MD  alprazolam Duanne Moron) 2 MG tablet Take 2 mg by mouth 3 (three) times daily.   Yes Historical Provider, MD  aspirin EC 81 MG tablet Take 81 mg by mouth every morning.   Yes Historical Provider, MD  clopidogrel (PLAVIX) 75 MG tablet Take 75 mg by mouth daily.   Yes Historical Provider, MD  diphenhydrAMINE (SOMINEX) 25 MG tablet Take 25 mg by mouth daily as needed for itching, allergies or sleep.   Yes Historical Provider, MD  insulin NPH-regular Human (NOVOLIN 70/30) (70-30) 100 UNIT/ML injection Inject 15-20 Units into the skin 3 (three) times daily. 15 units in am, 20 units around 4pm and 15 units at night   Yes Historical Provider, MD  isosorbide dinitrate (ISORDIL) 20 MG tablet Take 20 mg by mouth daily.   Yes Historical Provider, MD  levETIRAcetam (KEPPRA) 500 MG tablet Take 1,500 mg by mouth 2 (two) times daily.   Yes Historical Provider, MD  lisinopril (PRINIVIL,ZESTRIL) 10 MG tablet Take 10 mg by mouth daily.   Yes Historical Provider, MD  meclizine (ANTIVERT) 25 MG tablet Take 1 tablet (25 mg total) by mouth 2 (two) times daily as needed for dizziness. 11/02/14  Yes Marissa Sciacca, PA-C  morphine (MSIR) 30 MG tablet Take 30 mg by mouth every 4 (four) hours as needed for severe pain.   Yes Historical Provider, MD  naproxen sodium (ANAPROX) 220 MG tablet Take 220 mg by mouth daily as needed (for pain).   Yes Historical Provider, MD  nitroGLYCERIN (NITROSTAT) 0.4 MG SL tablet Place 0.4 mg under the tongue every 5 (five) minutes as needed for chest pain.   Yes Historical Provider, MD  ondansetron (ZOFRAN) 4 MG tablet Take 1 tablet (4 mg total) by mouth  every 8 (eight) hours as needed for nausea or vomiting. 09/25/14  Yes Rolland Porter, MD  phenytoin (DILANTIN) 100 MG ER capsule Take 300 mg by mouth at bedtime.    Yes Historical Provider, MD  polyethylene glycol (MIRALAX / GLYCOLAX) packet Take 17 g by mouth 2 (two) times daily.   Yes Historical Provider, MD  risperiDONE (RISPERDAL) 2 MG tablet Take 2 mg by mouth at bedtime.   Yes Historical Provider, MD  simvastatin (ZOCOR) 20 MG tablet Take 20 mg by mouth daily.   Yes Historical Provider, MD  traZODone (DESYREL) 100 MG tablet Take 200 mg by mouth at bedtime.   Yes Historical Provider, MD  venlafaxine XR (EFFEXOR-XR) 75 MG 24 hr capsule Take 75 mg by mouth daily with breakfast.   Yes Historical Provider, MD  promethazine-codeine ([place])  6.25-10 MG/5ML syrup Take 5 mLs by mouth 2 (two) times daily as needed for cough.    Historical Provider, MD    Physical Exam: BP 163/108 mmHg  Pulse 97  Temp(Src) 98 F (36.7 C) (Oral)  Resp 24  SpO2 99%  LMP 06/07/2015  GENERAL : Well developed, well nourished, alert and cooperative, and appears to be in no acute distress. HEAD: normocephalic. EYES: PERRL,legally blind EARS:  hearing grossly intact. NOSE: No nasal discharge. THROAT: Oral cavity and pharynx normal.   NECK: Neck supple. CARDIAC: Normal S1 and S2. No S3, S4 or murmurs. Rhythm is regular. LUNGS: decreased bilateral sounds with mild crackles. No wheezing. ABDOMEN: Positive bowel sounds. Soft, nondistended, nontender. No guarding or rebound. No masses.Marland Kitchen EXTREMITIES: LE edema. NEUROLOGICAL: The mental examination revealed the patient was oriented to person, place, and time.CN II-XII intact. Strength and sensation symmetric and intact throughout. Reflexes 2+ throughout. Cerebellar testing normal. SKIN: Skin normal color. PSYCHIATRIC:  The patient was able to demonstrate good judgement and reason, without hallucinations, abnormal affect or abnormal behaviors during the  examination. Patient is not suicidal.          Labs on Admission:  Reviewed.   Radiological Exams on Admission: Dg Chest 2 View  06/21/2015   CLINICAL DATA:  Short of breath. Chest pain. Cough and congestion. History of hypertension and asthma. History of smoking.  EXAM: CHEST  2 VIEW  COMPARISON:  03/23/2015  FINDINGS: There is subtle area of airspace opacity lateral to left hilum, which appears to be a change from the prior study. The change could be technical only, but a small area of pneumonia is suspected.  No other evidence of pneumonia and no evidence of pulmonary edema. No pleural effusion or pneumothorax.  Cardiac silhouette is mildly enlarged. No mediastinal or hilar masses or evidence of adenopathy.  Bony thorax is intact.  IMPRESSION: 1. Probable small area of pneumonia in the left perihilar region best seen on frontal view.   Electronically Signed   By: Lajean Manes M.D.   On: 06/21/2015 15:07   Ct Angio Chest Pe W/cm &/or Wo Cm  06/21/2015   CLINICAL DATA:  44 year old female with acute shortness of breath and cough for 2 days, as well as abdominal and pelvic pain, nausea and vomiting for 2 weeks.  EXAM: CT ANGIOGRAPHY CHEST  CT ABDOMEN AND PELVIS WITH CONTRAST  TECHNIQUE: Multidetector CT imaging of the chest was performed using the standard protocol during bolus administration of intravenous contrast. Multiplanar CT image reconstructions and MIPs were obtained to evaluate the vascular anatomy. Multidetector CT imaging of the abdomen and pelvis was performed using the standard protocol during bolus administration of intravenous contrast.  The abdomen and pelvis study was performed and subsequently, the CTA chest was ordered.  Suboptimal opacification of the pulmonary arterial system was noted on the CTA chest and the patient received 175 cc of intravenous contrast. After discussion with Irena Cords, PA, a repeat CTA chest was not performed given the amount of intravenous contrast and  face history of diabetes.  CONTRAST:  159mL OMNIPAQUE IOHEXOL 350 MG/ML SOLN  COMPARISON:  None.  FINDINGS: CTA CHEST FINDINGS  This study is suboptimal for the evaluation of pulmonary emboli secondary to poor pulmonary arterial contrast opacification and respiratory motion artifact.  Mediastinum/Nodes: No large main pulmonary emboli are identified. There is no evidence of thoracic aortic aneurysm or dissection. Cardiomegaly is present. There is no evidence of pericardial effusion.  Lungs/Pleura: A small left pleural  effusion is noted. Central ground-glass opacities are nonspecific and may represent infection, inflammation versus edema. Focal left lower lobe basilar opacity is noted, favor pneumonia over atelectasis. There is no evidence of pneumothorax. No endobronchial or endotracheal lesions are identified.  Musculoskeletal: No acute or suspicious abnormalities.  CT ABDOMEN and PELVIS FINDINGS  Hepatobiliary: Moderate-severe hepatic steatosis identified. The patient is status post cholecystectomy. There is no evidence of biliary dilatation.  Pancreas: Unremarkable  Spleen: Unremarkable  Adrenals/Urinary Tract: The kidneys, adrenal glands and bladder are unremarkable except for small right renal cysts.  Stomach/Bowel: Unremarkable. There is no evidence of bowel obstruction or focal bowel wall thickening. The appendix is normal.  Vascular/Lymphatic: No enlarged lymph nodes or abdominal aortic aneurysm.  Reproductive: 2.5 cm left ovarian cyst/follicle is noted. The uterus and right adnexal regions are unremarkable.  Other: No free fluid, abscess or pneumoperitoneum.  Musculoskeletal: No acute or suspicious abnormalities.  Review of the MIP images confirms the above findings.  IMPRESSION: Left lower lobe basilar opacity - favor pneumonia over atelectasis. Small left pleural effusion.  Mild central ground-glass opacities which are nonspecific and may represent edema, inflammation or infection.  Suboptimal for  evaluation of pulmonary emboli.  No acute abnormality within the abdomen or pelvis. Moderate to severe hepatic steatosis.   Electronically Signed   By: Margarette Canada M.D.   On: 06/21/2015 18:10   Ct Abdomen Pelvis W Contrast  06/21/2015   CLINICAL DATA:  44 year old female with acute shortness of breath and cough for 2 days, as well as abdominal and pelvic pain, nausea and vomiting for 2 weeks.  EXAM: CT ANGIOGRAPHY CHEST  CT ABDOMEN AND PELVIS WITH CONTRAST  TECHNIQUE: Multidetector CT imaging of the chest was performed using the standard protocol during bolus administration of intravenous contrast. Multiplanar CT image reconstructions and MIPs were obtained to evaluate the vascular anatomy. Multidetector CT imaging of the abdomen and pelvis was performed using the standard protocol during bolus administration of intravenous contrast.  The abdomen and pelvis study was performed and subsequently, the CTA chest was ordered.  Suboptimal opacification of the pulmonary arterial system was noted on the CTA chest and the patient received 175 cc of intravenous contrast. After discussion with Irena Cords, PA, a repeat CTA chest was not performed given the amount of intravenous contrast and face history of diabetes.  CONTRAST:  130mL OMNIPAQUE IOHEXOL 350 MG/ML SOLN  COMPARISON:  None.  FINDINGS: CTA CHEST FINDINGS  This study is suboptimal for the evaluation of pulmonary emboli secondary to poor pulmonary arterial contrast opacification and respiratory motion artifact.  Mediastinum/Nodes: No large main pulmonary emboli are identified. There is no evidence of thoracic aortic aneurysm or dissection. Cardiomegaly is present. There is no evidence of pericardial effusion.  Lungs/Pleura: A small left pleural effusion is noted. Central ground-glass opacities are nonspecific and may represent infection, inflammation versus edema. Focal left lower lobe basilar opacity is noted, favor pneumonia over atelectasis. There is no  evidence of pneumothorax. No endobronchial or endotracheal lesions are identified.  Musculoskeletal: No acute or suspicious abnormalities.  CT ABDOMEN and PELVIS FINDINGS  Hepatobiliary: Moderate-severe hepatic steatosis identified. The patient is status post cholecystectomy. There is no evidence of biliary dilatation.  Pancreas: Unremarkable  Spleen: Unremarkable  Adrenals/Urinary Tract: The kidneys, adrenal glands and bladder are unremarkable except for small right renal cysts.  Stomach/Bowel: Unremarkable. There is no evidence of bowel obstruction or focal bowel wall thickening. The appendix is normal.  Vascular/Lymphatic: No enlarged lymph nodes or abdominal aortic  aneurysm.  Reproductive: 2.5 cm left ovarian cyst/follicle is noted. The uterus and right adnexal regions are unremarkable.  Other: No free fluid, abscess or pneumoperitoneum.  Musculoskeletal: No acute or suspicious abnormalities.  Review of the MIP images confirms the above findings.  IMPRESSION: Left lower lobe basilar opacity - favor pneumonia over atelectasis. Small left pleural effusion.  Mild central ground-glass opacities which are nonspecific and may represent edema, inflammation or infection.  Suboptimal for evaluation of pulmonary emboli.  No acute abnormality within the abdomen or pelvis. Moderate to severe hepatic steatosis.   Electronically Signed   By: Margarette Canada M.D.   On: 06/21/2015 18:10    Assessment/Plan  CAP:  Started on Levofloxacin Sputum cx sent   Diastolic HF: With possible exacerbation : orthopnea/PND  Will give lasix 40 mg IV once Will need Echo in am and outpatient f/u with cardio  COPD/asthma:  Start Symbicort Continue albuterol as needed Not in exacerbation   TIA/CVA: Continue zocor and asp/statin  DM: Start on 70/30 15 u BID AC with low dose correction   DVT prophylaxis: [place] enoxaparin /SCDs. Code Status: Full  Gennaro Africa M.D [facility]

## 2015-06-21 NOTE — ED Notes (Signed)
SW & clergy bedside

## 2015-06-21 NOTE — Progress Notes (Signed)
Brief Pharmacy note: Levaquin per pharmacy  Levaquin 750mg  IV q24 ordered for CAP, renal function wnl  Can change to po Levaquin when response seen, noted patient has anxiety issues  Pharmacy will sign off.  Minda Ditto PharmD Pager [phone] 06/21/2015, 10:16 PM

## 2015-06-21 NOTE — ED Notes (Signed)
Bed: [facility]ALA Expected date:  Expected time:  Means of arrival:  Comments: 

## 2015-06-21 NOTE — ED Notes (Signed)
MD at bedside. 

## 2015-06-21 NOTE — ED Notes (Signed)
Patient transported to CT 

## 2015-06-21 NOTE — Progress Notes (Signed)
Chaplain paged to assist with anxious patient. Kathy Ellis reports that her family is without food, he husband Kathy Ellis is recovering from an auto accident which caused him to lose his job. The husband is with Kathy Ellis, with her comfort dog. Because of the dog the husband is not allowed into the hospital. Kathy Ellis is frightened without her husband and dog with her. The husband came with a SCAT pass, but he cannot ride home without her present with him.    A CSW consult was put in and the CSW came to assist with these problems. At this writing Kathy Ellis has not been admitted and until this is done, husband, dog and patient anxiety remains problematic.  Kathy Ellis reports her anxiety is a 5 on a scale of 1 to 10.  Cordie Buening, DMin Chaplain

## 2015-06-21 NOTE — ED Notes (Signed)
O2 stats remain between 94-96% without oxygen

## 2015-06-21 NOTE — ED Notes (Signed)
Bed: WA06 Expected date:  Expected time:  Means of arrival:  Comments: [facility] 

## 2015-06-21 NOTE — Progress Notes (Signed)
   06/21/15 1600  Clinical Encounter Type  Visited With Family  Visit Type Initial;ED;Social support;Other (Comment) (Food insecurity)  Referral From Other (Comment);Nurse (spouse request)  Consult/Referral To Social work  Stress Factors  Family Stress Factors Health changes;Financial concerns    Spiritual care consulted pt's spouse request.    Pt's spouse, Legrand Como, reported family is struggling financially and does not have food until food stamps arrive on Thursday.   They have a 52 year old boy, who is currently staying with a neighbor.    Legrand Como is working at [employer] and picking up jobs on side.  He is on probation at work due to absences related to wife's healthcare.  Fearful of losing job.  He was hopeful to work mowing grass today, but is at hospital with pt.     Legrand Como states he utilized [employer] for food assistance (four times) and emergency utility assistance.  States [employer] has a four use per year limit and he is not able to receive assistance again until January.   Has been in contact with [employer], but is not currently homeless.    In conversation with Social Work, Clinical biochemist provided "little green book" of free meals in [place] as well as [employer] locations in [place].   Provided Legrand Como with sealed canned goods from personal supply.     [place], [place]

## 2015-06-22 ENCOUNTER — Inpatient Hospital Stay (HOSPITAL_COMMUNITY): Payer: Medicaid Other

## 2015-06-22 DIAGNOSIS — I798 Other disorders of arteries, arterioles and capillaries in diseases classified elsewhere: Secondary | ICD-10-CM

## 2015-06-22 DIAGNOSIS — I6350 Cerebral infarction due to unspecified occlusion or stenosis of unspecified cerebral artery: Secondary | ICD-10-CM

## 2015-06-22 DIAGNOSIS — R0600 Dyspnea, unspecified: Secondary | ICD-10-CM

## 2015-06-22 DIAGNOSIS — I639 Cerebral infarction, unspecified: Secondary | ICD-10-CM

## 2015-06-22 DIAGNOSIS — R0602 Shortness of breath: Secondary | ICD-10-CM

## 2015-06-22 DIAGNOSIS — F329 Major depressive disorder, single episode, unspecified: Secondary | ICD-10-CM

## 2015-06-22 DIAGNOSIS — I5033 Acute on chronic diastolic (congestive) heart failure: Secondary | ICD-10-CM | POA: Insufficient documentation

## 2015-06-22 DIAGNOSIS — E1159 Type 2 diabetes mellitus with other circulatory complications: Secondary | ICD-10-CM

## 2015-06-22 LAB — COMPREHENSIVE METABOLIC PANEL
ALK PHOS: 78 U/L (ref 38–126)
ALT: 39 U/L (ref 14–54)
AST: 32 U/L (ref 15–41)
Albumin: 3.3 g/dL — ABNORMAL LOW (ref 3.5–5.0)
Anion gap: 10 (ref 5–15)
BUN: 15 mg/dL (ref 6–20)
CALCIUM: 8.4 mg/dL — AB (ref 8.9–10.3)
CHLORIDE: 99 mmol/L — AB (ref 101–111)
CO2: 26 mmol/L (ref 22–32)
CREATININE: 0.74 mg/dL (ref 0.44–1.00)
Glucose, Bld: 132 mg/dL — ABNORMAL HIGH (ref 65–99)
Potassium: 3.3 mmol/L — ABNORMAL LOW (ref 3.5–5.1)
SODIUM: 135 mmol/L (ref 135–145)
Total Bilirubin: 1.1 mg/dL (ref 0.3–1.2)
Total Protein: 6.6 g/dL (ref 6.5–8.1)

## 2015-06-22 LAB — CBC WITH DIFFERENTIAL/PLATELET
BASOS PCT: 0 %
Basophils Absolute: 0 10*3/uL (ref 0.0–0.1)
EOS ABS: 0.2 10*3/uL (ref 0.0–0.7)
EOS PCT: 2 %
HCT: 42.4 % (ref 36.0–46.0)
Hemoglobin: 14 g/dL (ref 12.0–15.0)
LYMPHS ABS: 2.2 10*3/uL (ref 0.7–4.0)
Lymphocytes Relative: 26 %
MCH: 29.1 pg (ref 26.0–34.0)
MCHC: 33 g/dL (ref 30.0–36.0)
MCV: 88.1 fL (ref 78.0–100.0)
MONOS PCT: 7 %
Monocytes Absolute: 0.6 10*3/uL (ref 0.1–1.0)
Neutro Abs: 5.6 10*3/uL (ref 1.7–7.7)
Neutrophils Relative %: 65 %
PLATELETS: 274 10*3/uL (ref 150–400)
RBC: 4.81 MIL/uL (ref 3.87–5.11)
RDW: 14.5 % (ref 11.5–15.5)
WBC: 8.7 10*3/uL (ref 4.0–10.5)

## 2015-06-22 LAB — GLUCOSE, CAPILLARY
GLUCOSE-CAPILLARY: 114 mg/dL — AB (ref 65–99)
Glucose-Capillary: 139 mg/dL — ABNORMAL HIGH (ref 65–99)
Glucose-Capillary: 197 mg/dL — ABNORMAL HIGH (ref 65–99)
Glucose-Capillary: 141 mg/dL — ABNORMAL HIGH (ref 65–99)

## 2015-06-22 MED ORDER — HYDROCODONE-ACETAMINOPHEN 5-325 MG PO TABS
1.0000 | ORAL_TABLET | Freq: Four times a day (QID) | ORAL | Status: DC | PRN
  Administered 2015-06-22 – 2015-06-23 (×3): 1 via ORAL
  Filled 2015-06-22 (×3): qty 1

## 2015-06-22 MED ORDER — GUAIFENESIN ER 600 MG PO TB12
600.0000 mg | ORAL_TABLET | Freq: Two times a day (BID) | ORAL | Status: DC
  Administered 2015-06-22 – 2015-06-24 (×4): 600 mg via ORAL
  Filled 2015-06-22 (×5): qty 1

## 2015-06-22 MED ORDER — FUROSEMIDE 10 MG/ML IJ SOLN
40.0000 mg | Freq: Two times a day (BID) | INTRAMUSCULAR | Status: DC
  Administered 2015-06-22 – 2015-06-23 (×3): 40 mg via INTRAVENOUS
  Filled 2015-06-22 (×4): qty 4

## 2015-06-22 MED ORDER — POLYETHYLENE GLYCOL 3350 17 G PO PACK
17.0000 g | PACK | Freq: Every day | ORAL | Status: DC
  Administered 2015-06-23: 17 g via ORAL
  Filled 2015-06-22 (×3): qty 1

## 2015-06-22 MED ORDER — BUDESONIDE 0.25 MG/2ML IN SUSP
0.2500 mg | Freq: Two times a day (BID) | RESPIRATORY_TRACT | Status: DC
  Administered 2015-06-22 – 2015-06-24 (×5): 0.25 mg via RESPIRATORY_TRACT
  Filled 2015-06-22 (×5): qty 2

## 2015-06-22 MED ORDER — LISINOPRIL 20 MG PO TABS
20.0000 mg | ORAL_TABLET | Freq: Every day | ORAL | Status: DC
  Administered 2015-06-22 – 2015-06-24 (×3): 20 mg via ORAL
  Filled 2015-06-22 (×3): qty 1

## 2015-06-22 MED ORDER — ALPRAZOLAM 1 MG PO TABS
1.0000 mg | ORAL_TABLET | Freq: Three times a day (TID) | ORAL | Status: DC
  Administered 2015-06-22 – 2015-06-24 (×6): 1 mg via ORAL
  Filled 2015-06-22 (×6): qty 1

## 2015-06-22 MED ORDER — FUROSEMIDE 10 MG/ML IJ SOLN
INTRAMUSCULAR | Status: AC
  Filled 2015-06-22: qty 4

## 2015-06-22 MED ORDER — PERFLUTREN LIPID MICROSPHERE
1.0000 mL | INTRAVENOUS | Status: AC | PRN
  Administered 2015-06-22: 2 mL via INTRAVENOUS
  Filled 2015-06-22: qty 10

## 2015-06-22 MED ORDER — VENLAFAXINE HCL ER 75 MG PO CP24
75.0000 mg | ORAL_CAPSULE | Freq: Every day | ORAL | Status: DC
  Administered 2015-06-22 – 2015-06-24 (×3): 75 mg via ORAL
  Filled 2015-06-22 (×4): qty 1

## 2015-06-22 MED ORDER — IPRATROPIUM-ALBUTEROL 0.5-2.5 (3) MG/3ML IN SOLN
3.0000 mL | Freq: Four times a day (QID) | RESPIRATORY_TRACT | Status: DC
  Administered 2015-06-22: 3 mL via RESPIRATORY_TRACT
  Filled 2015-06-22: qty 3

## 2015-06-22 MED ORDER — MECLIZINE HCL 25 MG PO TABS
25.0000 mg | ORAL_TABLET | Freq: Once | ORAL | Status: AC
  Administered 2015-06-23: 25 mg via ORAL
  Filled 2015-06-22: qty 1

## 2015-06-22 MED ORDER — PERFLUTREN LIPID MICROSPHERE
INTRAVENOUS | Status: AC
  Filled 2015-06-22: qty 10

## 2015-06-22 MED ORDER — HYDRALAZINE HCL 20 MG/ML IJ SOLN
10.0000 mg | Freq: Four times a day (QID) | INTRAMUSCULAR | Status: DC | PRN
  Administered 2015-06-22: 10 mg via INTRAVENOUS
  Filled 2015-06-22: qty 1

## 2015-06-22 MED ORDER — POTASSIUM CHLORIDE CRYS ER 20 MEQ PO TBCR
40.0000 meq | EXTENDED_RELEASE_TABLET | Freq: Two times a day (BID) | ORAL | Status: AC
  Administered 2015-06-22 – 2015-06-23 (×3): 40 meq via ORAL
  Filled 2015-06-22 (×3): qty 2

## 2015-06-22 MED ORDER — ALBUTEROL SULFATE (2.5 MG/3ML) 0.083% IN NEBU: 2.5000 mg | INHALATION_SOLUTION | RESPIRATORY_TRACT | Status: DC | PRN

## 2015-06-22 MED ORDER — IPRATROPIUM-ALBUTEROL 0.5-2.5 (3) MG/3ML IN SOLN
3.0000 mL | Freq: Three times a day (TID) | RESPIRATORY_TRACT | Status: DC
  Administered 2015-06-22 – 2015-06-24 (×6): 3 mL via RESPIRATORY_TRACT
  Filled 2015-06-22 (×6): qty 3

## 2015-06-22 NOTE — Progress Notes (Signed)
Initial Nutrition Assessment  DOCUMENTATION CODES:   Morbid obesity  INTERVENTION:  - RD will continue to monitor for needs and will make adjustments at follow-up if necessary  NUTRITION DIAGNOSIS:   Inadequate oral intake related to poor appetite as evidenced by per patient/family report.  GOAL:   Patient will meet greater than or equal to 90% of their needs  MONITOR:   PO intake, Weight trends, Labs, I & O's  REASON FOR ASSESSMENT:   Malnutrition Screening Tool  ASSESSMENT:   44 year old female with history of DM, TIA, CHF, CVA who presented with cc of dyspnea for the past two weeks. She has had cough productive of thick brownish sputum with chest pain in mid sternal area that is not exertional with fever and chills. She also has had headaches, nausea, and constipation with mild abdominal discomfort. She has had worsening orthopnea and PND over the last couple of months with worsening LE edema ( 3 > 6 pillow orthopnea). She became legally blind a few months ago.  Pt seen for MST. BMI indicates morbid obesity. Pt eating breakfast at time of visit; she had consumed 50% grits with butter and 3 slices of bacon and 3 small pancakes untouched so far. Pt reports appetite has been decreased x2 weeks PTA and that during this time frame she lost 25-30 lbs. Pt reports prior to losing weight her UBW was 270 lbs. Current weight of 286 lbs would indicate 16 lb weight gain; will continue to monitor weight trends during admission. No muscle or fat wasting at this time.  Pt on the phone with her husband when RD had entered room so discussion was more brief. Unsure if pt is meeting needs at this time but chart review indicates family was without food for unknown amount of time PTA. Medications reviewed. Labs reviewed; K: 3.3 mmol/L, Cl: 99 mmol/L, Ca: 8.4 mg/dL.   Diet Order:  Diet Carb Modified Fluid consistency:: Thin; Room service appropriate?: Yes  Skin:  Reviewed, no issues  Last BM:   PTA  Height:   Ht Readings from Last 1 Encounters:  06/21/15 5\' 7"  (1.702 m)    Weight:   Wt Readings from Last 1 Encounters:  06/21/15 286 lb 6 oz (129.9 kg)    Ideal Body Weight:  61.36 kg (kg)  BMI:  Body mass index is 44.84 kg/(m^2).  Estimated Nutritional Needs:   Kcal:  1850-2050  Protein:  75-85 grams  Fluid:  2-2.2 L/day  EDUCATION NEEDS:   No education needs identified at this time     Jarome Matin, RD, LDN Inpatient Clinical Dietitian Pager # [phone] After hours/weekend pager # [phone]

## 2015-06-22 NOTE — Progress Notes (Addendum)
Chaplain paged through the hospital operator. Chaplain visited with Kathy Ellis and her husband Legrand Como. Husband wished to tell the chaplain that he got a ride home last night and may need a ride home tonight. Chaplain advised staff to alert CSW if the need is great, as chaplains do not issue bus passes.  Sallee Lange. Kassidy Dockendorf, [place]

## 2015-06-22 NOTE — Care Management Note (Signed)
Case Management Note  Patient Details  Name: Kathy Ellis MRN: [id number] Date of Birth: [birth year]  Subjective/Objective:                 Hx of copd admitted with increased 02 needs   Action/Plan:  Will follow for needs   Expected Discharge Date:   (unknown)               Expected Discharge Plan:  Home/Self Care  In-House Referral:  NA  Discharge planning Services  CM Consult  Post Acute Care Choice:  NA Choice offered to:  NA  DME Arranged:    DME Agency:     HH Arranged:    HH Agency:     Status of Service:  In process, will continue to follow  Medicare Important Message Given:    Date Medicare IM Given:    Medicare IM give by:    Date Additional Medicare IM Given:    Additional Medicare Important Message give by:     If discussed at [place] of Stay Meetings, dates discussed:    Additional Comments:  Leeroy Cha, RN 06/22/2015, 1:02 PM

## 2015-06-22 NOTE — Progress Notes (Signed)
Pt has become increasingly drowsy and unable to keep her eyes open and hold a conversation with staff which has changed since her 0730 am assessment. VSS, no pain meds or other meds were given before this chagne. MD is aware and states he will be up to see her asap

## 2015-06-22 NOTE — Progress Notes (Signed)
[facility] PROGRESS NOTE  Kathy Ellis ELF:[id number] DOB: Jan 17, 1971 DOA: 06/21/2015 PCP: No PCP Per Patient  Assessment/Plan: 1-SOB: multifactorial; due to CAP and acute on chronic diastolic heart failure  -will continue tx with levaquin -will add pulmicort, flutter valve and mucinex -continue PRN nebulizer treatment  -will check daily weights, strict intake and output -will continue IV lasix -2-D echo with preserved EF and grade 1 diastolic heart failure -will use heart healthy diet (discussed with patient importance of low sodium diet at discharge)  2-uncontrolled HTN:  -will increase lisinopril dose -continue PRN hydralazine -also on lasix 40mg  IV BID -will continue IMDUR  3-hx of seizures:  -will continue keppra  4-obesity -Body mass index is 44.84 kg/(m^2). -low calorie diet and exercise discussed with patient   5-hx of CVA: no new neurologic deficit -will continue ASA and plavix for secondary prevention  6-type 2 diabetes(on chronic insulin) -will continue 70/30 and SSI -will check A1C  7-depression/anxiety: no SI and no hallucinations -will continue psych home meds  8-HLD: will continue zocor -will check lipid panel  Code Status: Full Family Communication: no family at bedside Disposition Plan: continue inpatient for further treatment of her PNA and further diuresis to treat her acute on chronic diastolic HF exacerbation   Consultants:  None   Procedures:  Echo: 10/12 - Left ventricle: The cavity size was normal. There was mild concentric hypertrophy. Systolic function was normal. The estimated ejection fraction was in the range of 55% to 60%. Wall motion was normal; there were no regional wall motion abnormalities. Doppler parameters are consistent with abnormal left ventricular relaxation (grade 1 diastolic dysfunction). - Left atrium: The atrium was mildly dilated.  Antibiotics:  levaquin 10/11  HPI/Subjective: Afebrile,  feeling better and breathing easier. Still feeling SOB and with positive wheezing. Patient also with findings of fluid overload.   Objective: Filed Vitals:   06/22/15 1424  BP: 124/55  Pulse: 105  Temp: 99 F (37.2 C)  Resp: 20    Intake/Output Summary (Last 24 hours) at 06/22/15 1758 Last data filed at 06/22/15 0544  Gross per 24 hour  Intake      0 ml  Output      1 ml  Net     -1 ml   Filed Weights   06/21/15 2238  Weight: 129.9 kg (286 lb 6 oz)    Exam:   General:  Afebrile, AAOX3, legally blind. Reports breathing is slightly better. Still wheezing and complaining of intermittent CP. Positive coughing spells  Cardiovascular: S1 and S2, no rubs or gallops  Respiratory: positive exp wheezing, diffuse rhonchi, decrease BS at bases  Abdomen: soft, NT, ND, positive BS, no guarding  Musculoskeletal: 2+ edema, no cyanosis   Data Reviewed: Basic Metabolic Panel:  Recent Labs Lab 06/21/15 1618 06/22/15 0520  NA 136 135  K 3.9 3.3*  CL 102 99*  CO2 24 26  GLUCOSE 119* 132*  BUN 14 15  CREATININE 0.76 0.74  CALCIUM 8.6* 8.4*   Liver Function Tests:  Recent Labs Lab 06/21/15 1618 06/22/15 0520  AST 42* 32  ALT 38 39  ALKPHOS 77 78  BILITOT 0.9 1.1  PROT 6.6 6.6  ALBUMIN 3.5 3.3*   CBC:  Recent Labs Lab 06/21/15 1618 06/22/15 0520  WBC 12.2* 8.7  NEUTROABS 9.1* 5.6  HGB 14.1 14.0  HCT 41.5 42.4  MCV 86.6 88.1  PLT 270 274   CBG:  Recent Labs Lab 06/21/15 1417 06/22/15 0756 06/22/15 0953 06/22/15 1313 06/22/15  1637  GLUCAP 153* 114* 139* 197* 141*    Studies: Dg Chest 2 View  06/21/2015   CLINICAL DATA:  Short of breath. Chest pain. Cough and congestion. History of hypertension and asthma. History of smoking.  EXAM: CHEST  2 VIEW  COMPARISON:  03/23/2015  FINDINGS: There is subtle area of airspace opacity lateral to left hilum, which appears to be a change from the prior study. The change could be technical only, but a small area of  pneumonia is suspected.  No other evidence of pneumonia and no evidence of pulmonary edema. No pleural effusion or pneumothorax.  Cardiac silhouette is mildly enlarged. No mediastinal or hilar masses or evidence of adenopathy.  Bony thorax is intact.  IMPRESSION: 1. Probable small area of pneumonia in the left perihilar region best seen on frontal view.   Electronically Signed   By: Lajean Manes M.D.   On: 06/21/2015 15:07   Ct Angio Chest Pe W/cm &/or Wo Cm  06/21/2015   CLINICAL DATA:  44 year old female with acute shortness of breath and cough for 2 days, as well as abdominal and pelvic pain, nausea and vomiting for 2 weeks.  EXAM: CT ANGIOGRAPHY CHEST  CT ABDOMEN AND PELVIS WITH CONTRAST  TECHNIQUE: Multidetector CT imaging of the chest was performed using the standard protocol during bolus administration of intravenous contrast. Multiplanar CT image reconstructions and MIPs were obtained to evaluate the vascular anatomy. Multidetector CT imaging of the abdomen and pelvis was performed using the standard protocol during bolus administration of intravenous contrast.  The abdomen and pelvis study was performed and subsequently, the CTA chest was ordered.  Suboptimal opacification of the pulmonary arterial system was noted on the CTA chest and the patient received 175 cc of intravenous contrast. After discussion with Irena Cords, PA, a repeat CTA chest was not performed given the amount of intravenous contrast and face history of diabetes.  CONTRAST:  168mL OMNIPAQUE IOHEXOL 350 MG/ML SOLN  COMPARISON:  None.  FINDINGS: CTA CHEST FINDINGS  This study is suboptimal for the evaluation of pulmonary emboli secondary to poor pulmonary arterial contrast opacification and respiratory motion artifact.  Mediastinum/Nodes: No large main pulmonary emboli are identified. There is no evidence of thoracic aortic aneurysm or dissection. Cardiomegaly is present. There is no evidence of pericardial effusion.  Lungs/Pleura: A  small left pleural effusion is noted. Central ground-glass opacities are nonspecific and may represent infection, inflammation versus edema. Focal left lower lobe basilar opacity is noted, favor pneumonia over atelectasis. There is no evidence of pneumothorax. No endobronchial or endotracheal lesions are identified.  Musculoskeletal: No acute or suspicious abnormalities.  CT ABDOMEN and PELVIS FINDINGS  Hepatobiliary: Moderate-severe hepatic steatosis identified. The patient is status post cholecystectomy. There is no evidence of biliary dilatation.  Pancreas: Unremarkable  Spleen: Unremarkable  Adrenals/Urinary Tract: The kidneys, adrenal glands and bladder are unremarkable except for small right renal cysts.  Stomach/Bowel: Unremarkable. There is no evidence of bowel obstruction or focal bowel wall thickening. The appendix is normal.  Vascular/Lymphatic: No enlarged lymph nodes or abdominal aortic aneurysm.  Reproductive: 2.5 cm left ovarian cyst/follicle is noted. The uterus and right adnexal regions are unremarkable.  Other: No free fluid, abscess or pneumoperitoneum.  Musculoskeletal: No acute or suspicious abnormalities.  Review of the MIP images confirms the above findings.  IMPRESSION: Left lower lobe basilar opacity - favor pneumonia over atelectasis. Small left pleural effusion.  Mild central ground-glass opacities which are nonspecific and may represent edema, inflammation or infection.  Suboptimal for evaluation of pulmonary emboli.  No acute abnormality within the abdomen or pelvis. Moderate to severe hepatic steatosis.   Electronically Signed   By: Margarette Canada M.D.   On: 06/21/2015 18:10   Ct Abdomen Pelvis W Contrast  06/21/2015   CLINICAL DATA:  44 year old female with acute shortness of breath and cough for 2 days, as well as abdominal and pelvic pain, nausea and vomiting for 2 weeks.  EXAM: CT ANGIOGRAPHY CHEST  CT ABDOMEN AND PELVIS WITH CONTRAST  TECHNIQUE: Multidetector CT imaging of the  chest was performed using the standard protocol during bolus administration of intravenous contrast. Multiplanar CT image reconstructions and MIPs were obtained to evaluate the vascular anatomy. Multidetector CT imaging of the abdomen and pelvis was performed using the standard protocol during bolus administration of intravenous contrast.  The abdomen and pelvis study was performed and subsequently, the CTA chest was ordered.  Suboptimal opacification of the pulmonary arterial system was noted on the CTA chest and the patient received 175 cc of intravenous contrast. After discussion with Irena Cords, PA, a repeat CTA chest was not performed given the amount of intravenous contrast and face history of diabetes.  CONTRAST:  134mL OMNIPAQUE IOHEXOL 350 MG/ML SOLN  COMPARISON:  None.  FINDINGS: CTA CHEST FINDINGS  This study is suboptimal for the evaluation of pulmonary emboli secondary to poor pulmonary arterial contrast opacification and respiratory motion artifact.  Mediastinum/Nodes: No large main pulmonary emboli are identified. There is no evidence of thoracic aortic aneurysm or dissection. Cardiomegaly is present. There is no evidence of pericardial effusion.  Lungs/Pleura: A small left pleural effusion is noted. Central ground-glass opacities are nonspecific and may represent infection, inflammation versus edema. Focal left lower lobe basilar opacity is noted, favor pneumonia over atelectasis. There is no evidence of pneumothorax. No endobronchial or endotracheal lesions are identified.  Musculoskeletal: No acute or suspicious abnormalities.  CT ABDOMEN and PELVIS FINDINGS  Hepatobiliary: Moderate-severe hepatic steatosis identified. The patient is status post cholecystectomy. There is no evidence of biliary dilatation.  Pancreas: Unremarkable  Spleen: Unremarkable  Adrenals/Urinary Tract: The kidneys, adrenal glands and bladder are unremarkable except for small right renal cysts.  Stomach/Bowel: Unremarkable.  There is no evidence of bowel obstruction or focal bowel wall thickening. The appendix is normal.  Vascular/Lymphatic: No enlarged lymph nodes or abdominal aortic aneurysm.  Reproductive: 2.5 cm left ovarian cyst/follicle is noted. The uterus and right adnexal regions are unremarkable.  Other: No free fluid, abscess or pneumoperitoneum.  Musculoskeletal: No acute or suspicious abnormalities.  Review of the MIP images confirms the above findings.  IMPRESSION: Left lower lobe basilar opacity - favor pneumonia over atelectasis. Small left pleural effusion.  Mild central ground-glass opacities which are nonspecific and may represent edema, inflammation or infection.  Suboptimal for evaluation of pulmonary emboli.  No acute abnormality within the abdomen or pelvis. Moderate to severe hepatic steatosis.   Electronically Signed   By: Margarette Canada M.D.   On: 06/21/2015 18:10    Scheduled Meds: . alprazolam  1 mg Oral TID  . antiseptic oral rinse  7 mL Mouth Rinse q12n4p  . aspirin EC  81 mg Oral q morning - 10a  . budesonide (PULMICORT) nebulizer solution  0.25 mg Nebulization BID  . chlorhexidine  15 mL Mouth Rinse BID  . clopidogrel  75 mg Oral Daily  . enoxaparin (LOVENOX) injection  65 mg Subcutaneous QHS  . furosemide  40 mg Intravenous BID  . insulin aspart  0-9  Units Subcutaneous TID WC  . insulin aspart protamine- aspart  15 Units Subcutaneous BID WC  . ipratropium-albuterol  3 mL Nebulization TID  . isosorbide mononitrate  30 mg Oral Daily  . levETIRAcetam  1,500 mg Oral BID  . levofloxacin (LEVAQUIN) IV  750 mg Intravenous Q24H  . lisinopril  20 mg Oral Daily  .  morphine injection  0.5 mg Intravenous Once  . phenytoin  300 mg Oral QHS  . potassium chloride  40 mEq Oral BID  . risperiDONE  2 mg Oral QHS  . simvastatin  20 mg Oral q1800  . venlafaxine XR  75 mg Oral Q breakfast   Continuous Infusions:   Active Problems:   CAP (community acquired pneumonia)   Pneumonia    Time spent:  29 minutes    Barton Dubois  [facility] Pager [phone] If 7PM-7AM, please contact night-coverage at [email or web address], password [facility] 06/22/2015, 5:58 PM  LOS: 1 day

## 2015-06-22 NOTE — Progress Notes (Addendum)
Echocardiogram 2D Echocardiogram has been performed. Definity was given due to poor image quality. The patients IV infiltrated during administration. The nurse attempted to get another IV started without success.  Joelene Millin 06/22/2015, 1:30 PM

## 2015-06-23 DIAGNOSIS — I10 Essential (primary) hypertension: Secondary | ICD-10-CM

## 2015-06-23 DIAGNOSIS — I5033 Acute on chronic diastolic (congestive) heart failure: Secondary | ICD-10-CM

## 2015-06-23 DIAGNOSIS — J189 Pneumonia, unspecified organism: Secondary | ICD-10-CM

## 2015-06-23 LAB — BASIC METABOLIC PANEL
ANION GAP: 6 (ref 5–15)
BUN: 18 mg/dL (ref 6–20)
CALCIUM: 8.6 mg/dL — AB (ref 8.9–10.3)
CO2: 26 mmol/L (ref 22–32)
Chloride: 105 mmol/L (ref 101–111)
Creatinine, Ser: 0.89 mg/dL (ref 0.44–1.00)
Glucose, Bld: 132 mg/dL — ABNORMAL HIGH (ref 65–99)
Potassium: 3.7 mmol/L (ref 3.5–5.1)
Sodium: 137 mmol/L (ref 135–145)

## 2015-06-23 LAB — PHENYTOIN LEVEL, TOTAL

## 2015-06-23 LAB — HEMOGLOBIN A1C
HEMOGLOBIN A1C: 7.3 % — AB (ref 4.8–5.6)
MEAN PLASMA GLUCOSE: 163 mg/dL

## 2015-06-23 LAB — LIPID PANEL
CHOLESTEROL: 156 mg/dL (ref 0–200)
HDL: 37 mg/dL — AB (ref >40)
LDL Cholesterol: 76 mg/dL (ref 0–99)
TRIGLYCERIDES: 213 mg/dL — AB (ref <150)
Total CHOL/HDL Ratio: 4.2 RATIO
VLDL: 43 mg/dL — ABNORMAL HIGH (ref 0–40)

## 2015-06-23 LAB — GLUCOSE, CAPILLARY
GLUCOSE-CAPILLARY: 97 mg/dL (ref 65–99)
GLUCOSE-CAPILLARY: 106 mg/dL — AB (ref 65–99)
GLUCOSE-CAPILLARY: 125 mg/dL — AB (ref 65–99)
Glucose-Capillary: 160 mg/dL — ABNORMAL HIGH (ref 65–99)
Glucose-Capillary: 178 mg/dL — ABNORMAL HIGH (ref 65–99)

## 2015-06-23 LAB — BRAIN NATRIURETIC PEPTIDE: B Natriuretic Peptide: 22 pg/mL (ref 0.0–100.0)

## 2015-06-23 MED ORDER — MECLIZINE HCL 25 MG PO TABS
25.0000 mg | ORAL_TABLET | Freq: Two times a day (BID) | ORAL | Status: DC | PRN
  Administered 2015-06-23 (×2): 25 mg via ORAL
  Filled 2015-06-23 (×3): qty 1

## 2015-06-23 MED ORDER — LEVOFLOXACIN 500 MG PO TABS
500.0000 mg | ORAL_TABLET | Freq: Every day | ORAL | Status: DC
  Administered 2015-06-23 – 2015-06-24 (×2): 500 mg via ORAL
  Filled 2015-06-23 (×2): qty 1

## 2015-06-23 NOTE — Hospital Discharge Follow-Up (Signed)
Transitional Care Clinic Care Coordination Note:  Admit date:  06/21/15 Discharge date: TBD Discharge Disposition: Home with spouse Patient contact: [phone] (mobile) Emergency contact(s): none  This Case Manager reviewed patient's EMR and determined patient would benefit from post-discharge medical management and chronic care management services through the [facility]. Patient has a history of CHF, DM, uncontrolled HTN, CVA. Admitted for shortness of breath due to CAP and acute on chronic heart failure. Patient has had 1 admission and 5 ED visits in the last year.  This Case Manager met with patient and patient's spouse, Kathy Ellis, to discuss the services and medical management that can be provided at the [facility]. Patient verbalized understanding and agreed to receive post-discharge care at the [facility].   Patient scheduled for Transitional Care appointment on 06/30/15 at 1445 with Dr. Jarold Song.  Clinic information and appointment time provided to patient. Appointment information also placed on AVS.  Assessment:       Home Environment: Patient lives in a private residence with her spouse. Patient indicated she and her spouse moved to [place] from [place] in December 2015.       Support System: Spouse-Kathy Ellis       Level of functioning: Patient is legally blind so her spouse cooks her meals and assists her with bathing.       Home DME: White cane used for walking as patient is legally blind.       Home care services: none       Transportation: Patient indicated she has been approved to use [employer] for transportation to medical appointments. This Case Manager also provided patient and spouse with information about [facility]. Informed patient that she will have to call and do a transportation assessment over the phone to determine if she is eligible for Medicaid transportation to appointments. Patient verbalized  understanding and appreciative of information. Patient and spouse indicated they may call and complete transportation assessment in case [employer] transportation ever unavailable and alternate transportation needed.        Food/Nutrition: Patient's spouse cooks all meals. Patient indicated she has access to needed food.        Medications: Patient indicated she uses [facility] on [address] or [address] [facility] on [address] to get medications. She denied difficulty with copays or difficulty obtaining medications. She indicated her spouse picks up her medications for her.  Informed patient and spouse of available pharmacy resources at [facility]. Patient appreciative of information.        Identified Barriers: lack of PCP, new to community. In addition, patient is legally blind.        PCP: none. Patient will need to establish care with PCP at [facility] if possible after 30 days of follow-up and medical management with the [facility].             Arranged services:        Services communicated to Velva Harman, RN CM

## 2015-06-23 NOTE — Progress Notes (Signed)
[facility] PROGRESS NOTE    Progress Note   Kathy Ellis AST:[id number] DOB: 1971/02/21 DOA: 06/21/2015 PCP: No PCP Per Patient   Brief Narrative:   Kathy Ellis is an 44 y.o. female   Assessment/Plan:  SOB (shortness of breath)due to   CAP (community acquired pneumonia) and   Acute on chronic diastolic heart failure ([place]): - Started on IV Levaquin on admission she has defervesced and her leukocytosis has resolved. - We'll change to oral Levaquin. - She was mildly fluid overloaded on admission a 2-D echo was done that shows a grade 1 diastolic heart failure, she was started on IV Lasix 6 and she diureses well. Her BMP is pending. - She was started on indoor and hydralazine and lisinopril, with good response to continue current regimen.  Uncontrolled essential hypertension: Improved continue current regimen of hydralazine, indoor and Cipro.  History of seizures: None continue Keppra.  Obesity: Counseled.  History of CVA: Continue aspirin and Plavix for secondary stroke prevention.  Controlled diabetes mellitus2: Continue 7030 insulin Hemoglobin A1c is pending.  Depression/anxiety: No hallucinations has remained stable continue psychotropic meds.  Hyperlipidemia: Continue Zocor.     DVT Prophylaxis - Lovenox ordered.  Family Communication: Discussed with Husband over the phone Disposition Plan: Home when stable. Code Status:     Code Status Orders        Start     Ordered   06/21/15 2157  Full code   Continuous     06/21/15 2158        IV Access:    Peripheral IV   Procedures and diagnostic studies:   Dg Chest 2 View  06/21/2015  CLINICAL DATA:  Short of breath. Chest pain. Cough and congestion. History of hypertension and asthma. History of smoking. EXAM: CHEST  2 VIEW COMPARISON:  03/23/2015 FINDINGS: There is subtle area of airspace opacity lateral to left hilum, which appears to be a change from the prior study. The change could be  technical only, but a small area of pneumonia is suspected. No other evidence of pneumonia and no evidence of pulmonary edema. No pleural effusion or pneumothorax. Cardiac silhouette is mildly enlarged. No mediastinal or hilar masses or evidence of adenopathy. Bony thorax is intact. IMPRESSION: 1. Probable small area of pneumonia in the left perihilar region best seen on frontal view. Electronically Signed   By: Lajean Manes M.D.   On: 06/21/2015 15:07   Ct Angio Chest Pe W/cm &/or Wo Cm  06/21/2015  CLINICAL DATA:  44 year old female with acute shortness of breath and cough for 2 days, as well as abdominal and pelvic pain, nausea and vomiting for 2 weeks. EXAM: CT ANGIOGRAPHY CHEST CT ABDOMEN AND PELVIS WITH CONTRAST TECHNIQUE: Multidetector CT imaging of the chest was performed using the standard protocol during bolus administration of intravenous contrast. Multiplanar CT image reconstructions and MIPs were obtained to evaluate the vascular anatomy. Multidetector CT imaging of the abdomen and pelvis was performed using the standard protocol during bolus administration of intravenous contrast. The abdomen and pelvis study was performed and subsequently, the CTA chest was ordered. Suboptimal opacification of the pulmonary arterial system was noted on the CTA chest and the patient received 175 cc of intravenous contrast. After discussion with Irena Cords, PA, a repeat CTA chest was not performed given the amount of intravenous contrast and face history of diabetes. CONTRAST:  157mL OMNIPAQUE IOHEXOL 350 MG/ML SOLN COMPARISON:  None. FINDINGS: CTA CHEST FINDINGS This study is suboptimal for the evaluation of  pulmonary emboli secondary to poor pulmonary arterial contrast opacification and respiratory motion artifact. Mediastinum/Nodes: No large main pulmonary emboli are identified. There is no evidence of thoracic aortic aneurysm or dissection. Cardiomegaly is present. There is no evidence of pericardial  effusion. Lungs/Pleura: A small left pleural effusion is noted. Central ground-glass opacities are nonspecific and may represent infection, inflammation versus edema. Focal left lower lobe basilar opacity is noted, favor pneumonia over atelectasis. There is no evidence of pneumothorax. No endobronchial or endotracheal lesions are identified. Musculoskeletal: No acute or suspicious abnormalities. CT ABDOMEN and PELVIS FINDINGS Hepatobiliary: Moderate-severe hepatic steatosis identified. The patient is status post cholecystectomy. There is no evidence of biliary dilatation. Pancreas: Unremarkable Spleen: Unremarkable Adrenals/Urinary Tract: The kidneys, adrenal glands and bladder are unremarkable except for small right renal cysts. Stomach/Bowel: Unremarkable. There is no evidence of bowel obstruction or focal bowel wall thickening. The appendix is normal. Vascular/Lymphatic: No enlarged lymph nodes or abdominal aortic aneurysm. Reproductive: 2.5 cm left ovarian cyst/follicle is noted. The uterus and right adnexal regions are unremarkable. Other: No free fluid, abscess or pneumoperitoneum. Musculoskeletal: No acute or suspicious abnormalities. Review of the MIP images confirms the above findings. IMPRESSION: Left lower lobe basilar opacity - favor pneumonia over atelectasis. Small left pleural effusion. Mild central ground-glass opacities which are nonspecific and may represent edema, inflammation or infection. Suboptimal for evaluation of pulmonary emboli. No acute abnormality within the abdomen or pelvis. Moderate to severe hepatic steatosis. Electronically Signed   By: Margarette Canada M.D.   On: 06/21/2015 18:10   Ct Abdomen Pelvis W Contrast  06/21/2015  CLINICAL DATA:  44 year old female with acute shortness of breath and cough for 2 days, as well as abdominal and pelvic pain, nausea and vomiting for 2 weeks. EXAM: CT ANGIOGRAPHY CHEST CT ABDOMEN AND PELVIS WITH CONTRAST TECHNIQUE: Multidetector CT imaging of the  chest was performed using the standard protocol during bolus administration of intravenous contrast. Multiplanar CT image reconstructions and MIPs were obtained to evaluate the vascular anatomy. Multidetector CT imaging of the abdomen and pelvis was performed using the standard protocol during bolus administration of intravenous contrast. The abdomen and pelvis study was performed and subsequently, the CTA chest was ordered. Suboptimal opacification of the pulmonary arterial system was noted on the CTA chest and the patient received 175 cc of intravenous contrast. After discussion with Irena Cords, PA, a repeat CTA chest was not performed given the amount of intravenous contrast and face history of diabetes. CONTRAST:  148mL OMNIPAQUE IOHEXOL 350 MG/ML SOLN COMPARISON:  None. FINDINGS: CTA CHEST FINDINGS This study is suboptimal for the evaluation of pulmonary emboli secondary to poor pulmonary arterial contrast opacification and respiratory motion artifact. Mediastinum/Nodes: No large main pulmonary emboli are identified. There is no evidence of thoracic aortic aneurysm or dissection. Cardiomegaly is present. There is no evidence of pericardial effusion. Lungs/Pleura: A small left pleural effusion is noted. Central ground-glass opacities are nonspecific and may represent infection, inflammation versus edema. Focal left lower lobe basilar opacity is noted, favor pneumonia over atelectasis. There is no evidence of pneumothorax. No endobronchial or endotracheal lesions are identified. Musculoskeletal: No acute or suspicious abnormalities. CT ABDOMEN and PELVIS FINDINGS Hepatobiliary: Moderate-severe hepatic steatosis identified. The patient is status post cholecystectomy. There is no evidence of biliary dilatation. Pancreas: Unremarkable Spleen: Unremarkable Adrenals/Urinary Tract: The kidneys, adrenal glands and bladder are unremarkable except for small right renal cysts. Stomach/Bowel: Unremarkable. There is no  evidence of bowel obstruction or focal bowel wall thickening. The appendix is  normal. Vascular/Lymphatic: No enlarged lymph nodes or abdominal aortic aneurysm. Reproductive: 2.5 cm left ovarian cyst/follicle is noted. The uterus and right adnexal regions are unremarkable. Other: No free fluid, abscess or pneumoperitoneum. Musculoskeletal: No acute or suspicious abnormalities. Review of the MIP images confirms the above findings. IMPRESSION: Left lower lobe basilar opacity - favor pneumonia over atelectasis. Small left pleural effusion. Mild central ground-glass opacities which are nonspecific and may represent edema, inflammation or infection. Suboptimal for evaluation of pulmonary emboli. No acute abnormality within the abdomen or pelvis. Moderate to severe hepatic steatosis. Electronically Signed   By: Margarette Canada M.D.   On: 06/21/2015 18:10     Medical Consultants:    None.  Anti-Infectives:   Anti-infectives    Start     Dose/Rate Route Frequency Ordered Stop   06/22/15 1800  levofloxacin (LEVAQUIN) IVPB 750 mg     750 mg 100 mL/hr over 90 Minutes Intravenous Every 24 hours 06/21/15 2210     06/21/15 1900  levofloxacin (LEVAQUIN) IVPB 750 mg     750 mg 100 mL/hr over 90 Minutes Intravenous  Once 06/21/15 1849 06/21/15 2032      Subjective:    Kathy Ellis she relates she feels much better her energy has returned.  Objective:    Filed Vitals:   06/22/15 1949 06/22/15 1953 06/22/15 2132 06/23/15 0516  BP:   124/72 119/71  Pulse:   92 112  Temp:   97.6 F (36.4 C) 98.1 F (36.7 C)  TempSrc:   Oral Oral  Resp:   20 20  Height:      Weight:    129.4 kg (285 lb 4.4 oz)  SpO2: 93% 93% 95% 92%    Intake/Output Summary (Last 24 hours) at 06/23/15 0817 Last data filed at 06/22/15 2200  Gross per 24 hour  Intake    360 ml  Output      0 ml  Net    360 ml   Filed Weights   06/21/15 2238 06/23/15 0516  Weight: 129.9 kg (286 lb 6 oz) 129.4 kg (285 lb 4.4 oz)     Exam: Gen:  NAD Cardiovascular:  RRR, No M/R/G Respiratory:  Good air movement with crackles on the left Gastrointestinal:  Abdomen soft, NT/ND, + BS Extremities:  No C/E/C   Data Reviewed:    Labs: Basic Metabolic Panel:  Recent Labs Lab 06/21/15 1618 06/22/15 0520  NA 136 135  K 3.9 3.3*  CL 102 99*  CO2 24 26  GLUCOSE 119* 132*  BUN 14 15  CREATININE 0.76 0.74  CALCIUM 8.6* 8.4*   GFR Estimated Creatinine Clearance: 124.3 mL/min (by C-G formula based on Cr of 0.74). Liver Function Tests:  Recent Labs Lab 06/21/15 1618 06/22/15 0520  AST 42* 32  ALT 38 39  ALKPHOS 77 78  BILITOT 0.9 1.1  PROT 6.6 6.6  ALBUMIN 3.5 3.3*   No results for input(s): LIPASE, AMYLASE in the last 168 hours. No results for input(s): AMMONIA in the last 168 hours. Coagulation profile No results for input(s): INR, PROTIME in the last 168 hours.  CBC:  Recent Labs Lab 06/21/15 1618 06/22/15 0520  WBC 12.2* 8.7  NEUTROABS 9.1* 5.6  HGB 14.1 14.0  HCT 41.5 42.4  MCV 86.6 88.1  PLT 270 274   Cardiac Enzymes: No results for input(s): CKTOTAL, CKMB, CKMBINDEX, TROPONINI in the last 168 hours. BNP (last 3 results) No results for input(s): PROBNP in the last 8760 hours. CBG:  Recent Labs Lab 06/22/15 0953 06/22/15 1313 06/22/15 1637 06/22/15 2149 06/23/15 0733  GLUCAP 139* 197* 141* 97 106*   D-Dimer: No results for input(s): DDIMER in the last 72 hours. Hgb A1c: No results for input(s): HGBA1C in the last 72 hours. Lipid Profile: No results for input(s): CHOL, HDL, LDLCALC, TRIG, CHOLHDL, LDLDIRECT in the last 72 hours. Thyroid function studies: No results for input(s): TSH, T4TOTAL, T3FREE, THYROIDAB in the last 72 hours.  Invalid input(s): FREET3 Anemia work up: No results for input(s): VITAMINB12, FOLATE, FERRITIN, TIBC, IRON, RETICCTPCT in the last 72 hours. Sepsis Labs:  Recent Labs Lab 06/21/15 1618 06/22/15 0520  WBC 12.2* 8.7    Microbiology No results found for this or any previous visit (from the past 240 hour(s)).   Medications:   . alprazolam  1 mg Oral TID  . antiseptic oral rinse  7 mL Mouth Rinse q12n4p  . aspirin EC  81 mg Oral q morning - 10a  . budesonide (PULMICORT) nebulizer solution  0.25 mg Nebulization BID  . chlorhexidine  15 mL Mouth Rinse BID  . clopidogrel  75 mg Oral Daily  . enoxaparin (LOVENOX) injection  65 mg Subcutaneous QHS  . furosemide  40 mg Intravenous BID  . guaiFENesin  600 mg Oral BID  . insulin aspart  0-9 Units Subcutaneous TID WC  . insulin aspart protamine- aspart  15 Units Subcutaneous BID WC  . ipratropium-albuterol  3 mL Nebulization TID  . isosorbide mononitrate  30 mg Oral Daily  . levETIRAcetam  1,500 mg Oral BID  . levofloxacin (LEVAQUIN) IV  750 mg Intravenous Q24H  . lisinopril  20 mg Oral Daily  .  morphine injection  0.5 mg Intravenous Once  . phenytoin  300 mg Oral QHS  . polyethylene glycol  17 g Oral Daily  . potassium chloride  40 mEq Oral BID  . risperiDONE  2 mg Oral QHS  . simvastatin  20 mg Oral q1800  . venlafaxine XR  75 mg Oral Q breakfast   Continuous Infusions:   Time spent: 15 min   LOS: 2 days   Kathy Ellis  [facility] Pager [phone]  *Please refer to [email or web address], password [email or web address] to get updated schedule on who will round on this patient, as hospitalists switch teams weekly. If 7PM-7AM, please contact night-coverage at [email or web address], password [email or web address] for any overnight needs.  06/23/2015, 8:17 AM

## 2015-06-24 LAB — BASIC METABOLIC PANEL
Anion gap: 10 (ref 5–15)
BUN: 19 mg/dL (ref 6–20)
CO2: 22 mmol/L (ref 22–32)
CREATININE: 0.87 mg/dL (ref 0.44–1.00)
Calcium: 8.9 mg/dL (ref 8.9–10.3)
Chloride: 105 mmol/L (ref 101–111)
GFR calc Af Amer: >60 mL/min (ref >60)
Glucose, Bld: 128 mg/dL — ABNORMAL HIGH (ref 65–99)
Potassium: 5.3 mmol/L — ABNORMAL HIGH (ref 3.5–5.1)
SODIUM: 137 mmol/L (ref 135–145)

## 2015-06-24 LAB — GLUCOSE, CAPILLARY: GLUCOSE-CAPILLARY: 111 mg/dL — AB (ref 65–99)

## 2015-06-24 MED ORDER — PHENYTOIN SODIUM EXTENDED 100 MG PO CAPS: 300.0000 mg | ORAL_CAPSULE | Freq: Every day | ORAL | Status: AC

## 2015-06-24 MED ORDER — FUROSEMIDE 20 MG PO TABS: 20.0000 mg | ORAL_TABLET | Freq: Every day | ORAL | Status: DC

## 2015-06-24 MED ORDER — SODIUM POLYSTYRENE SULFONATE 15 GM/60ML PO SUSP
15.0000 g | Freq: Once | ORAL | Status: AC
  Administered 2015-06-24: 15 g via ORAL
  Filled 2015-06-24: qty 60

## 2015-06-24 MED ORDER — MORPHINE SULFATE 30 MG PO TABS: 30.0000 mg | ORAL_TABLET | ORAL | Status: DC | PRN

## 2015-06-24 MED ORDER — FUROSEMIDE 20 MG PO TABS
20.0000 mg | ORAL_TABLET | Freq: Every day | ORAL | Status: DC
  Administered 2015-06-24: 20 mg via ORAL
  Filled 2015-06-24: qty 1

## 2015-06-24 MED ORDER — LEVOFLOXACIN 500 MG PO TABS: 500.0000 mg | ORAL_TABLET | Freq: Every day | ORAL | Status: DC

## 2015-06-24 MED ORDER — SODIUM CHLORIDE 0.9 % IV SOLN
1000.0000 mg | Freq: Once | INTRAVENOUS | Status: DC
  Filled 2015-06-24: qty 20

## 2015-06-24 NOTE — Discharge Summary (Signed)
Physician Discharge Summary  Kathy Ellis [facility]:[id number] DOB: 06/25/1971 DOA: 06/21/2015  PCP: No PCP Per Patient  Admit date: 06/21/2015 Discharge date: 06/24/2015  Time spent: 35 minutes  Recommendations for Outpatient Follow-up:  1. Follow up with PCP in 1 week check a b-met  Discharge Diagnoses:  Active Problems:   CAP (community acquired pneumonia)   Pneumonia   Acute on chronic diastolic heart failure (HCC)   SOB (shortness of breath)   Essential hypertension   Discharge Condition: stable  Diet recommendation: regular  Filed Weights   06/21/15 2238 06/23/15 0516  Weight: 129.9 kg (286 lb 6 oz) 129.4 kg (285 lb 4.4 oz)    History of present illness:  44 year old female with past medical history of diabetes heart failure and CVA who presents to the ED as she's been having shortness of breath of 2 weeks with productive cough brownish sputum chest x-ray shows a pneumonia and she also had an elevated white count.  Hospital Course:  Shortness of breath likely due from community-acquired pneumonia and acute on chronic diastolic heart failure: On admission she was started on IV Levaquin and she defervesced and her leukocytosis resolved. She was transitioned to oral Levaquin she will she will continue for 7 day total as an outpatient. She was also started on IV Lasix that she was mildly diuresis, her 2-D echo showed grade 1 diastolic heart failure her electrolytes to monitor and creatinine remained stable. She will continue Lasix 20 mg daily at home only to follow-up with primary care doctor in a week.  Uncontrolled essential hypertension: She was started on hydralazine and Imdur. Next line she'll continue these as an outpatient along with Lasix.  History of seizures: She was continued on Dilantin and Keppra, her Dilantin level was checked which was is in 2.5 she was loaded with Dilantin and was advised to continue to take Dilantin and Keppra she had no events during her  hospital stay.  History of CVA: No changes were made.  Controlled diabetes mellitus type 2: A1c was 7.3 no changes were made to her insulin regimen she'll follow-up with her primary care doctor as an outpatient.  Depression/ianxiety No hallucination continue current regimen.   Procedures:  Ct abd and pelvis  Consultations:  none  Discharge Exam: Filed Vitals:   06/24/15 0650  BP: 150/107  Pulse: 95  Temp: 97.6 F (36.4 C)  Resp: 20    General: A&O x3 Cardiovascular: RRR Respiratory: good air movement CTA B/L  Discharge Instructions   Discharge Instructions    Diet - low sodium heart healthy    Complete by:  As directed      Increase activity slowly    Complete by:  As directed           Current Discharge Medication List    START taking these medications   Details  furosemide (LASIX) 20 MG tablet Take 1 tablet (20 mg total) by mouth daily. Qty: 30 tablet, Refills: 0    levofloxacin (LEVAQUIN) 500 MG tablet Take 1 tablet (500 mg total) by mouth daily. Qty: 4 tablet, Refills: 0      CONTINUE these medications which have CHANGED   Details  morphine (MSIR) 30 MG tablet Take 1 tablet (30 mg total) by mouth every 4 (four) hours as needed for severe pain. Qty: 15 tablet, Refills: 0    phenytoin (DILANTIN) 100 MG ER capsule Take 3 capsules (300 mg total) by mouth at bedtime. Qty: 30 capsule, Refills: 0  CONTINUE these medications which have NOT CHANGED   Details  albuterol (PROVENTIL HFA;VENTOLIN HFA) 108 (90 BASE) MCG/ACT inhaler Inhale 4 puffs into the lungs every 6 (six) hours as needed for wheezing or shortness of breath.    alprazolam (XANAX) 2 MG tablet Take 2 mg by mouth 3 (three) times daily.    aspirin EC 81 MG tablet Take 81 mg by mouth every morning.    clopidogrel (PLAVIX) 75 MG tablet Take 75 mg by mouth daily.    diphenhydrAMINE (SOMINEX) 25 MG tablet Take 25 mg by mouth daily as needed for itching, allergies or sleep.    insulin  NPH-regular Human (NOVOLIN 70/30) (70-30) 100 UNIT/ML injection Inject 15-20 Units into the skin 3 (three) times daily. 15 units in am, 20 units around 4pm and 15 units at night    isosorbide mononitrate (IMDUR) 30 MG 24 hr tablet Take 30 mg by mouth daily.    levETIRAcetam (KEPPRA) 500 MG tablet Take 1,500 mg by mouth 2 (two) times daily.    lisinopril (PRINIVIL,ZESTRIL) 10 MG tablet Take 10 mg by mouth daily.    meclizine (ANTIVERT) 25 MG tablet Take 1 tablet (25 mg total) by mouth 2 (two) times daily as needed for dizziness. Qty: 15 tablet, Refills: 0    naproxen sodium (ANAPROX) 220 MG tablet Take 220 mg by mouth daily as needed (for pain).    nitroGLYCERIN (NITROSTAT) 0.4 MG SL tablet Place 0.4 mg under the tongue every 5 (five) minutes as needed for chest pain.    ondansetron (ZOFRAN) 4 MG tablet Take 1 tablet (4 mg total) by mouth every 8 (eight) hours as needed for nausea or vomiting. Qty: 8 tablet, Refills: 0    polyethylene glycol (MIRALAX / GLYCOLAX) packet Take 17 g by mouth 2 (two) times daily.    risperiDONE (RISPERDAL) 2 MG tablet Take 2 mg by mouth at bedtime.    simvastatin (ZOCOR) 20 MG tablet Take 20 mg by mouth daily.    traZODone (DESYREL) 100 MG tablet Take 200 mg by mouth at bedtime.    venlafaxine XR (EFFEXOR-XR) 75 MG 24 hr capsule Take 75 mg by mouth daily with breakfast.    promethazine-codeine (PHENERGAN WITH CODEINE) 6.25-10 MG/5ML syrup Take 5 mLs by mouth 2 (two) times daily as needed for cough.       Allergies  Allergen Reactions  . Amoxicillin Anaphylaxis    Has patient had a PCN reaction causing immediate rash, facial/tongue/throat swelling, SOB or lightheadedness with hypotension: Yes Has patient had a PCN reaction causing severe rash involving mucus membranes or skin necrosis: Yes Has patient had a PCN reaction that required hospitalization Yes Has patient had a PCN reaction occurring within the last 10 years: No If all of the above answers  are "NO", then may proceed with Cephalosporin use.   . Ibuprofen Anaphylaxis  . Latex Hives and Swelling  . Penicillins Anaphylaxis    Has patient had a PCN reaction causing immediate rash, facial/tongue/throat swelling, SOB or lightheadedness with hypotension: Yes Has patient had a PCN reaction causing severe rash involving mucus membranes or skin necrosis: Yes Has patient had a PCN reaction that required hospitalization Yes Has patient had a PCN reaction occurring within the last 10 years: yes If all of the above answers are "NO", then may proceed with Cephalosporin use.   . Tramadol Anaphylaxis  . Azithromycin Hives  . Depakote [Divalproex Sodium] Other (See Comments)    hallucinations  . Lactose Intolerance (Gi) Nausea And  Vomiting  . Latex Hives and Swelling  . Prednisone Hives  . Haldol [Haloperidol Lactate]     Leg swelling  . Ibuprofen Hives  . Prednisone Hives   Follow-up Information    Follow up with Medicaid Transportation . Call on 06/22/2015.   Why:  Medicaid Transportation: [phone] or [phone] Transportation Supervisor [phone]    Contact information:   As a Medicaid client you MUST contact them each time you change address, move to another county or another state to keep your address updated  [place]:  [place]: [phone] (main) [email or web address] [place], [place] [address]        Follow up with [facility]     On 06/30/2015.   Why:  [facility] appointment on 06/30/15 at 2:45 pm with Dr. Jarold Song.   Contact information:   [address] [place] [address] [phone]       The results of significant diagnostics from this hospitalization (including imaging, microbiology, ancillary and laboratory) are listed below for reference.    Significant Diagnostic Studies: Dg Chest 2 View  06/21/2015  CLINICAL DATA:  Short of breath. Chest pain. Cough and congestion. History of  hypertension and asthma. History of smoking. EXAM: CHEST  2 VIEW COMPARISON:  03/23/2015 FINDINGS: There is subtle area of airspace opacity lateral to left hilum, which appears to be a change from the prior study. The change could be technical only, but a small area of pneumonia is suspected. No other evidence of pneumonia and no evidence of pulmonary edema. No pleural effusion or pneumothorax. Cardiac silhouette is mildly enlarged. No mediastinal or hilar masses or evidence of adenopathy. Bony thorax is intact. IMPRESSION: 1. Probable small area of pneumonia in the left perihilar region best seen on frontal view. Electronically Signed   By: Lajean Manes M.D.   On: 06/21/2015 15:07   Ct Angio Chest Pe W/cm &/or Wo Cm  06/21/2015  CLINICAL DATA:  44 year old female with acute shortness of breath and cough for 2 days, as well as abdominal and pelvic pain, nausea and vomiting for 2 weeks. EXAM: CT ANGIOGRAPHY CHEST CT ABDOMEN AND PELVIS WITH CONTRAST TECHNIQUE: Multidetector CT imaging of the chest was performed using the standard protocol during bolus administration of intravenous contrast. Multiplanar CT image reconstructions and MIPs were obtained to evaluate the vascular anatomy. Multidetector CT imaging of the abdomen and pelvis was performed using the standard protocol during bolus administration of intravenous contrast. The abdomen and pelvis study was performed and subsequently, the CTA chest was ordered. Suboptimal opacification of the pulmonary arterial system was noted on the CTA chest and the patient received 175 cc of intravenous contrast. After discussion with Irena Cords, PA, a repeat CTA chest was not performed given the amount of intravenous contrast and face history of diabetes. CONTRAST:  163m OMNIPAQUE IOHEXOL 350 MG/ML SOLN COMPARISON:  None. FINDINGS: CTA CHEST FINDINGS This study is suboptimal for the evaluation of pulmonary emboli secondary to poor pulmonary arterial contrast  opacification and respiratory motion artifact. Mediastinum/Nodes: No large main pulmonary emboli are identified. There is no evidence of thoracic aortic aneurysm or dissection. Cardiomegaly is present. There is no evidence of pericardial effusion. Lungs/Pleura: A small left pleural effusion is noted. Central ground-glass opacities are nonspecific and may represent infection, inflammation versus edema. Focal left lower lobe basilar opacity is noted, favor pneumonia over atelectasis. There is no evidence of pneumothorax. No endobronchial or endotracheal lesions are identified. Musculoskeletal: No acute  or suspicious abnormalities. CT ABDOMEN and PELVIS FINDINGS Hepatobiliary: Moderate-severe hepatic steatosis identified. The patient is status post cholecystectomy. There is no evidence of biliary dilatation. Pancreas: Unremarkable Spleen: Unremarkable Adrenals/Urinary Tract: The kidneys, adrenal glands and bladder are unremarkable except for small right renal cysts. Stomach/Bowel: Unremarkable. There is no evidence of bowel obstruction or focal bowel wall thickening. The appendix is normal. Vascular/Lymphatic: No enlarged lymph nodes or abdominal aortic aneurysm. Reproductive: 2.5 cm left ovarian cyst/follicle is noted. The uterus and right adnexal regions are unremarkable. Other: No free fluid, abscess or pneumoperitoneum. Musculoskeletal: No acute or suspicious abnormalities. Review of the MIP images confirms the above findings. IMPRESSION: Left lower lobe basilar opacity - favor pneumonia over atelectasis. Small left pleural effusion. Mild central ground-glass opacities which are nonspecific and may represent edema, inflammation or infection. Suboptimal for evaluation of pulmonary emboli. No acute abnormality within the abdomen or pelvis. Moderate to severe hepatic steatosis. Electronically Signed   By: Margarette Canada M.D.   On: 06/21/2015 18:10   Ct Abdomen Pelvis W Contrast  06/21/2015  CLINICAL DATA:   44 year old female with acute shortness of breath and cough for 2 days, as well as abdominal and pelvic pain, nausea and vomiting for 2 weeks. EXAM: CT ANGIOGRAPHY CHEST CT ABDOMEN AND PELVIS WITH CONTRAST TECHNIQUE: Multidetector CT imaging of the chest was performed using the standard protocol during bolus administration of intravenous contrast. Multiplanar CT image reconstructions and MIPs were obtained to evaluate the vascular anatomy. Multidetector CT imaging of the abdomen and pelvis was performed using the standard protocol during bolus administration of intravenous contrast. The abdomen and pelvis study was performed and subsequently, the CTA chest was ordered. Suboptimal opacification of the pulmonary arterial system was noted on the CTA chest and the patient received 175 cc of intravenous contrast. After discussion with Irena Cords, PA, a repeat CTA chest was not performed given the amount of intravenous contrast and face history of diabetes. CONTRAST:  176m OMNIPAQUE IOHEXOL 350 MG/ML SOLN COMPARISON:  None. FINDINGS: CTA CHEST FINDINGS This study is suboptimal for the evaluation of pulmonary emboli secondary to poor pulmonary arterial contrast opacification and respiratory motion artifact. Mediastinum/Nodes: No large main pulmonary emboli are identified. There is no evidence of thoracic aortic aneurysm or dissection. Cardiomegaly is present. There is no evidence of pericardial effusion. Lungs/Pleura: A small left pleural effusion is noted. Central ground-glass opacities are nonspecific and may represent infection, inflammation versus edema. Focal left lower lobe basilar opacity is noted, favor pneumonia over atelectasis. There is no evidence of pneumothorax. No endobronchial or endotracheal lesions are identified. Musculoskeletal: No acute or suspicious abnormalities. CT ABDOMEN and PELVIS FINDINGS Hepatobiliary: Moderate-severe hepatic steatosis identified. The patient is status post cholecystectomy.  There is no evidence of biliary dilatation. Pancreas: Unremarkable Spleen: Unremarkable Adrenals/Urinary Tract: The kidneys, adrenal glands and bladder are unremarkable except for small right renal cysts. Stomach/Bowel: Unremarkable. There is no evidence of bowel obstruction or focal bowel wall thickening. The appendix is normal. Vascular/Lymphatic: No enlarged lymph nodes or abdominal aortic aneurysm. Reproductive: 2.5 cm left ovarian cyst/follicle is noted. The uterus and right adnexal regions are unremarkable. Other: No free fluid, abscess or pneumoperitoneum. Musculoskeletal: No acute or suspicious abnormalities. Review of the MIP images confirms the above findings. IMPRESSION: Left lower lobe basilar opacity - favor pneumonia over atelectasis. Small left pleural effusion. Mild central ground-glass opacities which are nonspecific and may represent edema, inflammation or infection. Suboptimal for evaluation of pulmonary emboli. No acute abnormality within the abdomen or  pelvis. Moderate to severe hepatic steatosis. Electronically Signed   By: Margarette Canada M.D.   On: 06/21/2015 18:10    Microbiology: No results found for this or any previous visit (from the past 240 hour(s)).   Labs: Basic Metabolic Panel:  Recent Labs Lab 06/21/15 1618 06/22/15 0520 06/23/15 0550 06/24/15 0520  NA 136 135 137 137  K 3.9 3.3* 3.7 5.3*  CL 102 99* 105 105  CO2 _0 GLUCOSE 119* 132* 132* 128*  BUN _1 CREATININE 0.76 0.74 0.89 0.87  CALCIUM 8.6* 8.4* 8.6* 8.9   Liver Function Tests:  Recent Labs Lab 06/21/15 1618 06/22/15 0520  AST 42* 32  ALT 38 39  ALKPHOS 77 78  BILITOT 0.9 1.1  PROT 6.6 6.6  ALBUMIN 3.5 3.3*   No results for input(s): LIPASE, AMYLASE in the last 168 hours. No results for input(s): AMMONIA in the last 168 hours. CBC:  Recent Labs Lab 06/21/15 1618 06/22/15 0520  WBC 12.2* 8.7  NEUTROABS 9.1* 5.6  HGB 14.1 14.0  HCT 41.5 42.4  MCV 86.6 88.1  PLT  270 274   Cardiac Enzymes: No results for input(s): CKTOTAL, CKMB, CKMBINDEX, TROPONINI in the last 168 hours. BNP: BNP (last 3 results)  Recent Labs  06/23/15 0550  BNP 22.0    ProBNP (last 3 results) No results for input(s): PROBNP in the last 8760 hours.  CBG:  Recent Labs Lab 06/23/15 0733 06/23/15 1154 06/23/15 1641 06/23/15 2028 06/24/15 0737  GLUCAP 106* 125* 160* 178* 111*       Signed:  Charlynne Cousins  [facility] 06/24/2015, 9:40 AM

## 2015-06-25 ENCOUNTER — Telehealth: Payer: Self-pay

## 2015-06-25 NOTE — Telephone Encounter (Signed)
Addendum-Spoke with Dr. Jarold Song who indicated she would assess patient's need for a nebulizer at her appointment on 06/30/15. Called patient and provided updated. Patient verbalized understanding.

## 2015-06-25 NOTE — Telephone Encounter (Signed)
[facility] Post-discharge Follow-Up Phone Call:  Date of Discharge: 06/24/15 Principal Discharge Diagnosis(es): Community acquired pneumonia, acute on chronic diastolic heart failure Call Completed: Yes                  With Whom: Patient Interpreter Needed: No              Language/Dialect: English     Please check all that apply:  X  Patient is knowledgeable of his/her condition(s) and/or treatment. X  Patient is caring for self at home.  ? Patient is receiving assist at home from family and/or caregiver. Family and/or caregiver is knowledgeable of patient's condition(s) and/or treatment. ? Patient is receiving home health services. If so, name of agency.     Medication Reconciliation:  X  Medication list reviewed with patient. X  Patient obtained all discharge medications. Yes-Patient indicated her spouse picked up her discharge medications. Thoroughly reviewed patient's discharge medications and discussed importance of medication compliance. Patient verbalized understanding and indicated she was taking medications as prescribed.  Activities of Daily Living:    Independent X Needs assist-Patient is legally blind so her spouse cooks her meals and assists patient with bathing.  Patient uses a white cane for walking. ? [facility]    Community resources in place for patient:  X [facility] with Joelene Millin, Transitional Care liaison with [facility], who indicated  initial home visit for patient would be arranged. ? Home Health/Home DME ? Assisted Living ? Support Group              Questions/Concerns discussed: Patient indicated she was feeling better after discharge. She indicated she was still "a little weak."  Patient has all discharge medications and indicated she is taking them as prescribed. Reminded patient to bring all medications to her upcoming appointment.  Patient indicated she needed a home nebulizer. She indicated  she had one when she lived in [place], but she did not have one any longer. This Case Manager inquired if patient was given one at discharge, and she indicated she was not. Informed patient that this Case Manager would discuss with Dr. Jarold Song and give her a call back. Patient aware of initial [facility] appointment on 06/30/15 at 1445, and patient indicated she had transportation to her appointment.

## 2015-06-26 NOTE — ED Provider Notes (Signed)
CSN: [id number]     Arrival date & time 06/21/15  1341 History   First MD Initiated Contact with Patient 06/21/15 1504     Chief Complaint  Patient presents with  . Shortness of Breath     (Consider location/radiation/quality/duration/timing/severity/associated sxs/prior Treatment) HPI Patient presents to the emergency department with shortness of breath and cough.  This started over the last 2 days. patient states that she has been using her inhalers more often and she noticed that she had wheezing that was not relieved with this treatment. patient states that she is not very well controlled on her diabetes medications. patient denies chest pain, nausea, vomiting, weakness, dizziness, headache, blurred vision, back pain, neck pain, fever, runny nose, sore throat, rash, dysuria, incontinence, body stool, hematemesis, or syncope.patient states nothing seems make her condition better or worse.  Patient did not try any other medications other than her prescribed medication Past Medical History  Diagnosis Date  . Hypertension   . Diabetes mellitus without complication ([place])   . COPD (chronic obstructive pulmonary disease) ([place])   . CHF (congestive heart failure) ([place])   . Seizures ([place])   . Cancer ([place])   . Homelessness   . Deaf   . Legally blind     bilaterally    Past Surgical History  Procedure Laterality Date  . Brain surgery    . Cholecystectomy    . Tubal ligation     History reviewed. No pertinent family history. Social History  Substance Use Topics  . Smoking status: Current Every Day Smoker -- 1.00 packs/day for 34 years    Types: Cigarettes  . Smokeless tobacco: Never Used  . Alcohol Use: No   OB History    Gravida Para Term Preterm AB TAB SAB Ectopic Multiple Living   1              Review of Systems  All other systems negative except as documented in the HPI. All pertinent positives and negatives as reviewed in the HPI.  Allergies  Amoxicillin; Ibuprofen;  Latex; Penicillins; Tramadol; Azithromycin; Depakote; Lactose intolerance (gi); Latex; Prednisone; Haldol; Ibuprofen; and Prednisone  Home Medications   Prior to Admission medications   Medication Sig Start Date End Date Taking? Authorizing Provider  albuterol (PROVENTIL HFA;VENTOLIN HFA) 108 (90 BASE) MCG/ACT inhaler Inhale 4 puffs into the lungs every 6 (six) hours as needed for wheezing or shortness of breath.   Yes Historical Provider, MD  alprazolam Duanne Moron) 2 MG tablet Take 2 mg by mouth 3 (three) times daily.   Yes Historical Provider, MD  aspirin EC 81 MG tablet Take 81 mg by mouth every morning.   Yes Historical Provider, MD  clopidogrel (PLAVIX) 75 MG tablet Take 75 mg by mouth daily.   Yes Historical Provider, MD  diphenhydrAMINE (SOMINEX) 25 MG tablet Take 25 mg by mouth daily as needed for itching, allergies or sleep.   Yes Historical Provider, MD  insulin NPH-regular Human (NOVOLIN 70/30) (70-30) 100 UNIT/ML injection Inject 15-20 Units into the skin 3 (three) times daily. 15 units in am, 20 units around 4pm and 15 units at night   Yes Historical Provider, MD  isosorbide mononitrate (IMDUR) 30 MG 24 hr tablet Take 30 mg by mouth daily.   Yes Historical Provider, MD  levETIRAcetam (KEPPRA) 500 MG tablet Take 1,500 mg by mouth 2 (two) times daily.   Yes Historical Provider, MD  lisinopril (PRINIVIL,ZESTRIL) 10 MG tablet Take 10 mg by mouth daily.   Yes Historical  Provider, MD  meclizine (ANTIVERT) 25 MG tablet Take 1 tablet (25 mg total) by mouth 2 (two) times daily as needed for dizziness. 11/02/14  Yes Marissa Sciacca, PA-C  naproxen sodium (ANAPROX) 220 MG tablet Take 220 mg by mouth daily as needed (for pain).   Yes Historical Provider, MD  nitroGLYCERIN (NITROSTAT) 0.4 MG SL tablet Place 0.4 mg under the tongue every 5 (five) minutes as needed for chest pain.   Yes Historical Provider, MD  ondansetron (ZOFRAN) 4 MG tablet Take 1 tablet (4 mg total) by mouth every 8 (eight) hours as  needed for nausea or vomiting. 09/25/14  Yes Rolland Porter, MD  polyethylene glycol (MIRALAX / GLYCOLAX) packet Take 17 g by mouth 2 (two) times daily.   Yes Historical Provider, MD  risperiDONE (RISPERDAL) 2 MG tablet Take 2 mg by mouth at bedtime.   Yes Historical Provider, MD  simvastatin (ZOCOR) 20 MG tablet Take 20 mg by mouth daily.   Yes Historical Provider, MD  traZODone (DESYREL) 100 MG tablet Take 200 mg by mouth at bedtime.   Yes Historical Provider, MD  venlafaxine XR (EFFEXOR-XR) 75 MG 24 hr capsule Take 75 mg by mouth daily with breakfast.   Yes Historical Provider, MD  furosemide (LASIX) 20 MG tablet Take 1 tablet (20 mg total) by mouth daily. 06/24/15   Charlynne Cousins, MD  levofloxacin (LEVAQUIN) 500 MG tablet Take 1 tablet (500 mg total) by mouth daily. 06/24/15   Charlynne Cousins, MD  morphine (MSIR) 30 MG tablet Take 1 tablet (30 mg total) by mouth every 4 (four) hours as needed for severe pain. 06/24/15   Charlynne Cousins, MD  phenytoin (DILANTIN) 100 MG ER capsule Take 3 capsules (300 mg total) by mouth at bedtime. 06/24/15   Charlynne Cousins, MD  promethazine-codeine (PHENERGAN WITH CODEINE) 6.25-10 MG/5ML syrup Take 5 mLs by mouth 2 (two) times daily as needed for cough.    Historical Provider, MD   BP 150/107 mmHg  Pulse 95  Temp(Src) 97.6 F (36.4 C) (Oral)  Resp 20  Ht 5\' 7"  (1.702 m)  Wt 285 lb 4.4 oz (129.4 kg)  BMI 44.67 kg/m2  SpO2 93%  LMP 06/07/2015 Physical Exam  Constitutional: She is oriented to person, place, and time. She appears well-developed and well-nourished. No distress.  HENT:  Head: Normocephalic and atraumatic.  Mouth/Throat: Oropharynx is clear and moist.  Eyes: Pupils are equal, round, and reactive to light.  Neck: Normal range of motion. Neck supple.  Cardiovascular: Normal rate, regular rhythm and normal heart sounds.  Exam reveals no gallop and no friction rub.   No murmur heard. Pulmonary/Chest: Effort normal. No respiratory  distress. She has wheezes.  Abdominal: Soft. Bowel sounds are normal. She exhibits no distension. There is no tenderness.  Musculoskeletal: She exhibits no edema.  Neurological: She is alert and oriented to person, place, and time. She exhibits normal muscle tone. Coordination normal.  Skin: Skin is warm and dry. No rash noted. No erythema.  Psychiatric: She has a normal mood and affect. Her behavior is normal.  Nursing note and vitals reviewed.   ED Course  Procedures (including critical care time) Labs Review Labs Reviewed  COMPREHENSIVE METABOLIC PANEL - Abnormal; Notable for the following:    Glucose, Bld 119 (*)    Calcium 8.6 (*)    AST 42 (*)    All other components within normal limits  CBC WITH DIFFERENTIAL/PLATELET - Abnormal; Notable for the following:  WBC 12.2 (*)    Neutro Abs 9.1 (*)    All other components within normal limits  URINALYSIS, ROUTINE W REFLEX MICROSCOPIC (NOT AT [facility]) - Abnormal; Notable for the following:    Specific Gravity, Urine >1.046 (*)    Protein, ur >300 (*)    All other components within normal limits  URINE MICROSCOPIC-ADD ON - Abnormal; Notable for the following:    Squamous Epithelial / LPF FEW (*)    All other components within normal limits  COMPREHENSIVE METABOLIC PANEL - Abnormal; Notable for the following:    Potassium 3.3 (*)    Chloride 99 (*)    Glucose, Bld 132 (*)    Calcium 8.4 (*)    Albumin 3.3 (*)    All other components within normal limits  HEMOGLOBIN A1C - Abnormal; Notable for the following:    Hgb A1c MFr Bld 7.3 (*)    All other components within normal limits  GLUCOSE, CAPILLARY - Abnormal; Notable for the following:    Glucose-Capillary 114 (*)    All other components within normal limits  GLUCOSE, CAPILLARY - Abnormal; Notable for the following:    Glucose-Capillary 139 (*)    All other components within normal limits  GLUCOSE, CAPILLARY - Abnormal; Notable for the following:    Glucose-Capillary 197 (*)     All other components within normal limits  GLUCOSE, CAPILLARY - Abnormal; Notable for the following:    Glucose-Capillary 141 (*)    All other components within normal limits  LIPID PANEL - Abnormal; Notable for the following:    Triglycerides 213 (*)    HDL 37 (*)    VLDL 43 (*)    All other components within normal limits  GLUCOSE, CAPILLARY - Abnormal; Notable for the following:    Glucose-Capillary 106 (*)    All other components within normal limits  BASIC METABOLIC PANEL - Abnormal; Notable for the following:    Glucose, Bld 132 (*)    Calcium 8.6 (*)    All other components within normal limits  PHENYTOIN LEVEL, TOTAL - Abnormal; Notable for the following:    Phenytoin Lvl <2.5 (*)    All other components within normal limits  GLUCOSE, CAPILLARY - Abnormal; Notable for the following:    Glucose-Capillary 125 (*)    All other components within normal limits  GLUCOSE, CAPILLARY - Abnormal; Notable for the following:    Glucose-Capillary 160 (*)    All other components within normal limits  BASIC METABOLIC PANEL - Abnormal; Notable for the following:    Potassium 5.3 (*)    Glucose, Bld 128 (*)    All other components within normal limits  GLUCOSE, CAPILLARY - Abnormal; Notable for the following:    Glucose-Capillary 178 (*)    All other components within normal limits  GLUCOSE, CAPILLARY - Abnormal; Notable for the following:    Glucose-Capillary 111 (*)    All other components within normal limits  CBG MONITORING, ED - Abnormal; Notable for the following:    Glucose-Capillary 153 (*)    All other components within normal limits  CULTURE, EXPECTORATED SPUTUM-ASSESSMENT  CBC WITH DIFFERENTIAL/PLATELET  GLUCOSE, CAPILLARY  BRAIN NATRIURETIC PEPTIDE    Imaging Review No results found. I have personally reviewed and evaluated these images and lab results as part of my medical decision-making.   EKG Interpretation   Date/Time:  Monday June 21 2015 15:47:35  EDT Ventricular Rate:  105 PR Interval:  162 QRS Duration: 100 QT Interval:  397 QTC Calculation: 525 R Axis:   -54 Text Interpretation:  Sinus tachycardia Left anterior fascicular block  Abnormal R-wave progression, late transition Left ventricular hypertrophy  Nonspecific T abnormalities, lateral leads Prolonged QT interval Baseline  wander in lead(s) II III aVF no sig chnage from previous Confirmed by  Johnney Killian, MD, Jeannie Done [phone]) on 06/21/2015 5:00:30 PM      MDM   Final diagnoses:  CAP (community acquired pneumonia)      Patient be admitted to the hospital for further evaluation and care for hypoxia on room air. patient is advised plan and all questions were answered.  I spoke with the [facility] will [facility] the patient  Dalia Heading, PA-C 06/26/15 0059  Charlesetta Shanks, MD 06/27/15 [phone]

## 2015-06-29 ENCOUNTER — Telehealth: Payer: Self-pay

## 2015-06-29 NOTE — Telephone Encounter (Signed)
Attempted to contact the patient to remind her of her appointment at [facility] tomorrow, 06/30/15 @ 1445. Call placed to the patient and her husband answered and stated that the patient was sleeping . He took CM call back # and stated that he would have the patient return the call when she woke up.

## 2015-06-30 ENCOUNTER — Ambulatory Visit: Payer: Medicaid Other | Attending: Family Medicine | Admitting: Family Medicine

## 2015-06-30 ENCOUNTER — Encounter (HOSPITAL_BASED_OUTPATIENT_CLINIC_OR_DEPARTMENT_OTHER): Payer: Medicaid Other | Admitting: Clinical

## 2015-06-30 ENCOUNTER — Encounter: Payer: Self-pay | Admitting: Family Medicine

## 2015-06-30 VITALS — BP 164/110 | HR 94 | Temp 98.5°F | Resp 18 | Ht 67.0 in | Wt 271.0 lb

## 2015-06-30 DIAGNOSIS — E119 Type 2 diabetes mellitus without complications: Secondary | ICD-10-CM | POA: Diagnosis not present

## 2015-06-30 DIAGNOSIS — M545 Low back pain: Secondary | ICD-10-CM | POA: Insufficient documentation

## 2015-06-30 DIAGNOSIS — R32 Unspecified urinary incontinence: Secondary | ICD-10-CM | POA: Insufficient documentation

## 2015-06-30 DIAGNOSIS — R569 Unspecified convulsions: Secondary | ICD-10-CM | POA: Insufficient documentation

## 2015-06-30 DIAGNOSIS — F209 Schizophrenia, unspecified: Secondary | ICD-10-CM | POA: Insufficient documentation

## 2015-06-30 DIAGNOSIS — H548 Legal blindness, as defined in USA: Secondary | ICD-10-CM

## 2015-06-30 DIAGNOSIS — Z880 Allergy status to penicillin: Secondary | ICD-10-CM | POA: Diagnosis not present

## 2015-06-30 DIAGNOSIS — I5033 Acute on chronic diastolic (congestive) heart failure: Secondary | ICD-10-CM | POA: Diagnosis not present

## 2015-06-30 DIAGNOSIS — J449 Chronic obstructive pulmonary disease, unspecified: Secondary | ICD-10-CM | POA: Insufficient documentation

## 2015-06-30 DIAGNOSIS — Z79899 Other long term (current) drug therapy: Secondary | ICD-10-CM | POA: Diagnosis not present

## 2015-06-30 DIAGNOSIS — Z7982 Long term (current) use of aspirin: Secondary | ICD-10-CM | POA: Insufficient documentation

## 2015-06-30 DIAGNOSIS — Z794 Long term (current) use of insulin: Secondary | ICD-10-CM | POA: Diagnosis not present

## 2015-06-30 DIAGNOSIS — F17210 Nicotine dependence, cigarettes, uncomplicated: Secondary | ICD-10-CM | POA: Insufficient documentation

## 2015-06-30 DIAGNOSIS — Z91040 Latex allergy status: Secondary | ICD-10-CM | POA: Diagnosis not present

## 2015-06-30 DIAGNOSIS — G8929 Other chronic pain: Secondary | ICD-10-CM | POA: Diagnosis not present

## 2015-06-30 DIAGNOSIS — J189 Pneumonia, unspecified organism: Secondary | ICD-10-CM | POA: Diagnosis not present

## 2015-06-30 DIAGNOSIS — F319 Bipolar disorder, unspecified: Secondary | ICD-10-CM | POA: Diagnosis not present

## 2015-06-30 DIAGNOSIS — I5032 Chronic diastolic (congestive) heart failure: Secondary | ICD-10-CM

## 2015-06-30 DIAGNOSIS — I10 Essential (primary) hypertension: Secondary | ICD-10-CM | POA: Insufficient documentation

## 2015-06-30 DIAGNOSIS — K5900 Constipation, unspecified: Secondary | ICD-10-CM | POA: Diagnosis not present

## 2015-06-30 DIAGNOSIS — Z659 Problem related to unspecified psychosocial circumstances: Secondary | ICD-10-CM

## 2015-06-30 DIAGNOSIS — G894 Chronic pain syndrome: Secondary | ICD-10-CM

## 2015-06-30 DIAGNOSIS — Z8673 Personal history of transient ischemic attack (TIA), and cerebral infarction without residual deficits: Secondary | ICD-10-CM | POA: Insufficient documentation

## 2015-06-30 DIAGNOSIS — J438 Other emphysema: Secondary | ICD-10-CM

## 2015-06-30 DIAGNOSIS — N3946 Mixed incontinence: Secondary | ICD-10-CM

## 2015-06-30 LAB — BASIC METABOLIC PANEL
BUN: 16 mg/dL (ref 7–25)
CALCIUM: 9.2 mg/dL (ref 8.6–10.2)
CO2: 28 mmol/L (ref 20–31)
CREATININE: 0.91 mg/dL (ref 0.50–1.10)
Chloride: 101 mmol/L (ref 98–110)
Glucose, Bld: 102 mg/dL — ABNORMAL HIGH (ref 65–99)
POTASSIUM: 4.9 mmol/L (ref 3.5–5.3)
Sodium: 137 mmol/L (ref 135–146)

## 2015-06-30 LAB — GLUCOSE, POCT (MANUAL RESULT ENTRY): POC GLUCOSE: 104 mg/dL — AB (ref 70–99)

## 2015-06-30 MED ORDER — IPRATROPIUM-ALBUTEROL 0.5-2.5 (3) MG/3ML IN SOLN: 3.0000 mL | Freq: Four times a day (QID) | RESPIRATORY_TRACT | Status: DC | PRN

## 2015-06-30 MED ORDER — ACETAMINOPHEN-CODEINE #3 300-30 MG PO TABS: 1.0000 | ORAL_TABLET | Freq: Three times a day (TID) | ORAL | Status: DC | PRN

## 2015-06-30 MED ORDER — FLUTICASONE-SALMETEROL 100-50 MCG/DOSE IN AEPB: 1.0000 | INHALATION_SPRAY | Freq: Two times a day (BID) | RESPIRATORY_TRACT | Status: DC

## 2015-06-30 MED ORDER — LACTULOSE 10 GM/15ML PO SOLN: 10.0000 g | Freq: Three times a day (TID) | ORAL | Status: AC

## 2015-06-30 MED ORDER — CLONIDINE HCL 0.1 MG PO TABS
0.1000 mg | ORAL_TABLET | Freq: Once | ORAL | Status: AC
  Administered 2015-06-30: 0.1 mg via ORAL
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
## 2015-06-30 NOTE — Patient Instructions (Signed)
Hypertension Hypertension, commonly called high blood pressure, is when the force of blood pumping through your arteries is too strong. Your arteries are the blood vessels that carry blood from your heart throughout your body. A blood pressure reading consists of a higher number over a lower number, such as 110/72. The higher number (systolic) is the pressure inside your arteries when your heart pumps. The lower number (diastolic) is the pressure inside your arteries when your heart relaxes. Ideally you want your blood pressure below 120/80. Hypertension forces your heart to work harder to pump blood. Your arteries may become narrow or stiff. Having untreated or uncontrolled hypertension can cause heart attack, stroke, kidney disease, and other problems. RISK FACTORS Some risk factors for high blood pressure are controllable. Others are not.  Risk factors you cannot control include:   Race. You may be at higher risk if you are African American.  Age. Risk increases with age.  Gender. Men are at higher risk than women before age 45 years. After age 65, women are at higher risk than men. Risk factors you can control include:  Not getting enough exercise or physical activity.  Being overweight.  Getting too much fat, sugar, calories, or salt in your diet.  Drinking too much alcohol. SIGNS AND SYMPTOMS Hypertension does not usually cause signs or symptoms. Extremely high blood pressure (hypertensive crisis) may cause headache, anxiety, shortness of breath, and nosebleed. DIAGNOSIS To check if you have hypertension, your health care provider will measure your blood pressure while you are seated, with your arm held at the level of your heart. It should be measured at least twice using the same arm. Certain conditions can cause a difference in blood pressure between your right and left arms. A blood pressure reading that is higher than normal on one occasion does not mean that you need treatment. If  it is not clear whether you have high blood pressure, you may be asked to return on a different day to have your blood pressure checked again. Or, you may be asked to monitor your blood pressure at home for 1 or more weeks. TREATMENT Treating high blood pressure includes making lifestyle changes and possibly taking medicine. Living a healthy lifestyle can help lower high blood pressure. You may need to change some of your habits. Lifestyle changes may include:  Following the DASH diet. This diet is high in fruits, vegetables, and whole grains. It is low in salt, red meat, and added sugars.  Keep your sodium intake below 2,300 mg per day.  Getting at least 30-45 minutes of aerobic exercise at least 4 times per week.  Losing weight if necessary.  Not smoking.  Limiting alcoholic beverages.  Learning ways to reduce stress. Your health care provider may prescribe medicine if lifestyle changes are not enough to get your blood pressure under control, and if one of the following is true:  You are 18-59 years of age and your systolic blood pressure is above 140.  You are 60 years of age or older, and your systolic blood pressure is above 150.  Your diastolic blood pressure is above 90.  You have diabetes, and your systolic blood pressure is over 140 or your diastolic blood pressure is over 90.  You have kidney disease and your blood pressure is above 140/90.  You have heart disease and your blood pressure is above 140/90. Your personal target blood pressure may vary depending on your medical conditions, your age, and other factors. HOME CARE INSTRUCTIONS    Have your blood pressure rechecked as directed by your health care provider.   Take medicines only as directed by your health care provider. Follow the directions carefully. Blood pressure medicines must be taken as prescribed. The medicine does not work as well when you skip doses. Skipping doses also puts you at risk for  problems.  Do not smoke.   Monitor your blood pressure at home as directed by your health care provider. SEEK MEDICAL CARE IF:   You think you are having a reaction to medicines taken.  You have recurrent headaches or feel dizzy.  You have swelling in your ankles.  You have trouble with your vision. SEEK IMMEDIATE MEDICAL CARE IF:  You develop a severe headache or confusion.  You have unusual weakness, numbness, or feel faint.  You have severe chest or abdominal pain.  You vomit repeatedly.  You have trouble breathing. MAKE SURE YOU:   Understand these instructions.  Will watch your condition.  Will get help right away if you are not doing well or get worse.   This information is not intended to replace advice given to you by your health care provider. Make sure you discuss any questions you have with your health care provider.   Document Released: 08/28/2005 Document Revised: 01/12/2015 Document Reviewed: 06/20/2013 Elsevier Interactive Patient Education 2016 [employer]  

## 2015-06-30 NOTE — Progress Notes (Signed)
Pt's here for TCC CAP, and acute CHF rating pain at level 9.5. Pt c/o problems with swallowing.   Pt here to esc.care with PCP  Pt reports not taking her meds today.  Pt needs rx on all of her meds

## 2015-07-01 LAB — MICROALBUMIN / CREATININE URINE RATIO
CREATININE, URINE: 101 mg/dL (ref 20–320)
MICROALB UR: 68.7 mg/dL
MICROALB/CREAT RATIO: 680 ug/mg{creat} — AB (ref <30)

## 2015-07-01 NOTE — Progress Notes (Signed)
[place]  Date of Telephone Encounter: 06/25/15  Admit Date: 06/20/12 Discharge Date: 06/24/15  PCP: none  CC: Follow up from Hospitalization  HPI: Kathy Ellis is a 44 y.o. female who relocated from [place] early in the year and is in today to be established at the transitional care clinic after a recent hospitalization at [facility] or Pneumonia. Medical History is significant for COPD, seizures,CHF,Schizophrenia, Bipolar disorder history of CVA,  HTN, Type 2 DM, chronic low back pain (secondary to spinal stenosis).  She presented to the ED with cough productive of brownish sputum, shortness of breath, WBC revealed leukocytosis of 12.2, CXR revealed Pneumonia and she was admitted with acute respiratory failure secondary to Community Acquired Pneumonia and she was placed on IV Levaquin. Also placed on IV Lasix for CHF; 2d echo revealed EF of 55 to 53%, grade 1 Diastolic dysfunction. Her blood pressure was uncontrolled and so Hydralazine and Imdur were added to her regimen. Her other medications were continued. Symptoms improved and she was subsequently discharged on oral Levaquin.  Interval History: She still complains of cough productive of clear sputum, no fevers or chest pains. Also states she has been constipated. Blood pressure is elevated and she endorses forgetting to take her medications and relies on her husband to remind her. Requests a refill of morphine which she received from her previous physician for spinal stenosis.  Requests a script for depends as she has urinary incontinence.  Allergies  Allergen Reactions  . Amoxicillin Anaphylaxis    Has patient had a PCN reaction causing immediate rash, facial/tongue/throat swelling, SOB or lightheadedness with hypotension: Yes Has patient had a PCN reaction causing severe rash involving mucus membranes or skin necrosis: Yes Has patient had a PCN reaction that required hospitalization Yes Has  patient had a PCN reaction occurring within the last 10 years: No If all of the above answers are "NO", then may proceed with Cephalosporin use.   . Ibuprofen Anaphylaxis  . Latex Hives and Swelling  . Penicillins Anaphylaxis    Has patient had a PCN reaction causing immediate rash, facial/tongue/throat swelling, SOB or lightheadedness with hypotension: Yes Has patient had a PCN reaction causing severe rash involving mucus membranes or skin necrosis: Yes Has patient had a PCN reaction that required hospitalization Yes Has patient had a PCN reaction occurring within the last 10 years: yes If all of the above answers are "NO", then may proceed with Cephalosporin use.   . Tramadol Anaphylaxis  . Azithromycin Hives  . Depakote [Divalproex Sodium] Other (See Comments)    hallucinations  . Lactose Intolerance (Gi) Nausea And Vomiting  . Latex Hives and Swelling  . Prednisone Hives  . Haldol [Haloperidol Lactate]     Leg swelling  . Ibuprofen Hives  . Prednisone Hives   Past Medical History  Diagnosis Date  . Hypertension   . Diabetes mellitus without complication ([place])   . COPD (chronic obstructive pulmonary disease) ([place])   . CHF (congestive heart failure) ([place])   . Seizures ([place])   . Cancer ([place])   . Homelessness   . Deaf   . Legally blind     bilaterally    Current Outpatient Prescriptions on File Prior to Visit  Medication Sig Dispense Refill  . albuterol (PROVENTIL HFA;VENTOLIN HFA) 108 (90 BASE) MCG/ACT inhaler Inhale 4 puffs into the lungs every 6 (six) hours as needed for wheezing or shortness of breath.    . alprazolam (XANAX) 2 MG tablet Take 2  mg by mouth 3 (three) times daily.    Marland Kitchen aspirin EC 81 MG tablet Take 81 mg by mouth every morning.    . clopidogrel (PLAVIX) 75 MG tablet Take 75 mg by mouth daily.    . diphenhydrAMINE (SOMINEX) 25 MG tablet Take 25 mg by mouth daily as needed for itching, allergies or sleep.    . furosemide (LASIX) 20 MG tablet Take 1 tablet  (20 mg total) by mouth daily. 30 tablet 0  . insulin NPH-regular Human (NOVOLIN 70/30) (70-30) 100 UNIT/ML injection Inject 15-20 Units into the skin 3 (three) times daily. 15 units in am, 20 units around 4pm and 15 units at night    . isosorbide mononitrate (IMDUR) 30 MG 24 hr tablet Take 30 mg by mouth daily.    Marland Kitchen levETIRAcetam (KEPPRA) 500 MG tablet Take 1,500 mg by mouth 2 (two) times daily.    Marland Kitchen levofloxacin (LEVAQUIN) 500 MG tablet Take 1 tablet (500 mg total) by mouth daily. 4 tablet 0  . lisinopril (PRINIVIL,ZESTRIL) 10 MG tablet Take 10 mg by mouth daily.    . meclizine (ANTIVERT) 25 MG tablet Take 1 tablet (25 mg total) by mouth 2 (two) times daily as needed for dizziness. 15 tablet 0  . morphine (MSIR) 30 MG tablet Take 1 tablet (30 mg total) by mouth every 4 (four) hours as needed for severe pain. 15 tablet 0  . nitroGLYCERIN (NITROSTAT) 0.4 MG SL tablet Place 0.4 mg under the tongue every 5 (five) minutes as needed for chest pain.    Marland Kitchen ondansetron (ZOFRAN) 4 MG tablet Take 1 tablet (4 mg total) by mouth every 8 (eight) hours as needed for nausea or vomiting. 8 tablet 0  . phenytoin (DILANTIN) 100 MG ER capsule Take 3 capsules (300 mg total) by mouth at bedtime. 30 capsule 0  . polyethylene glycol (MIRALAX / GLYCOLAX) packet Take 17 g by mouth 2 (two) times daily.    . promethazine-codeine (PHENERGAN WITH CODEINE) 6.25-10 MG/5ML syrup Take 5 mLs by mouth 2 (two) times daily as needed for cough.    . risperiDONE (RISPERDAL) 2 MG tablet Take 2 mg by mouth at bedtime.    . simvastatin (ZOCOR) 20 MG tablet Take 20 mg by mouth daily.    . traZODone (DESYREL) 100 MG tablet Take 200 mg by mouth at bedtime.    Marland Kitchen venlafaxine XR (EFFEXOR-XR) 75 MG 24 hr capsule Take 75 mg by mouth daily with breakfast.    . naproxen sodium (ANAPROX) 220 MG tablet Take 220 mg by mouth daily as needed (for pain).     No current facility-administered medications on file prior to visit.   History reviewed. No  pertinent family history. Social History   Social History  . Marital Status: Single    Spouse Name: N/A  . Number of Children: N/A  . Years of Education: N/A   Occupational History  . Not on file.   Social History Main Topics  . Smoking status: Current Every Day Smoker -- 1.00 packs/day for 34 years    Types: Cigarettes  . Smokeless tobacco: Never Used  . Alcohol Use: No  . Drug Use: No  . Sexual Activity: Not on file   Other Topics Concern  . Not on file   Social History Narrative    Review of Systems: Constitutional: Negative for fever, chills, diaphoresis, activity change, appetite change and fatigue. HENT: Negative for ear pain, nosebleeds, congestion, facial swelling, rhinorrhea, neck pain, neck stiffness and ear  discharge.  Eyes: blind Respiratory: positive for cough,neg for choking, chest tightness, shortness of breath, wheezing and stridor.  Cardiovascular: Negative for chest pain, palpitations and leg swelling. Gastrointestinal: Negative for abdominal distention, positive for constipation Genitourinary: Negative for dysuria, urgency, frequency, hematuria, flank pain, decreased urine volume, difficulty urinating and dyspareunia.  Musculoskeletal: chronic low back pain Neurological: Negative for dizziness, tremors, seizures, syncope, facial asymmetry, speech difficulty, weakness, light-headedness, numbness and headaches.  Hematological: Negative for adenopathy. Does not bruise/bleed easily. Psychiatric/Behavioral: Negative for hallucinations, behavioral problems, confusion, dysphoric mood, decreased concentration and agitation.    Objective: Filed Vitals:   06/30/15 1441 06/30/15 1500 06/30/15 1551  BP: 175/120 168/110 164/110  Pulse: 94    Temp: 98.5 F (36.9 C)    TempSrc: Oral    Resp: 18    Height: 5\' 7"  (1.702 m)    Weight: 271 lb (122.925 kg)    SpO2: 94%          Physical Exam: Constitutional: Patient appears well-developed and well-nourished.  No distress. HENT: Normocephalic, atraumatic, External right and left ear normal. Oropharynx is clear and moist.  Eyes:legally blind Neck: Normal ROM. Neck supple. No JVD. No tracheal deviation. No thyromegaly. CVS: RRR, S1/S2 +, no murmurs, no gallops, no carotid bruit.  Pulmonary: Effort and breath sounds normal, no stridor, rhonchi, wheezes, rales.  Abdominal: Soft. BS +,  no distension, tenderness, rebound or guarding.  Musculoskeletal: Normal range of motion. No edema and lumbar spine tenderness.  Lymphadenopathy: No lymphadenopathy noted, cervical, inguinal or axillary Neuro: Alert. Normal reflexes, muscle tone coordination. No cranial nerve deficit. Skin: Skin is warm and dry. No rash noted. Not diaphoretic. No erythema. No pallor. Psychiatric: Normal mood and affect. Behavior, judgment, thought content normal.  Lab Results  Component Value Date   WBC 8.7 06/22/2015   HGB 14.0 06/22/2015   HCT 42.4 06/22/2015   MCV 88.1 06/22/2015   PLT 274 06/22/2015   Lab Results  Component Value Date   CREATININE 0.91 06/30/2015   BUN 16 06/30/2015   NA 137 06/30/2015   K 4.9 06/30/2015   CL 101 06/30/2015   CO2 28 06/30/2015    Lab Results  Component Value Date   HGBA1C 7.3* 06/22/2015   Lipid Panel     Component Value Date/Time   CHOL 156 06/23/2015 0550   TRIG 213* 06/23/2015 0550   HDL 37* 06/23/2015 0550   CHOLHDL 4.2 06/23/2015 0550   VLDL 43* 06/23/2015 0550   LDLCALC 76 06/23/2015 0550       Assessment and plan:  Community-acquired pneumonia: Completed course of Levaquin. Still symptomatic and oxygen saturation is low at 94% and she is still coughing; this could be attributed to suboptimal management of COPD.  COPD: I have added Advair to her regimen Ordered nebulizer machine as well as nebulizer solution for her.   Hypertension: Uncontrolled due to failure to take medication today. Clonidine 0.1 mg given and patient observed for 30 minutes and repeat blood  pressure obtained. Emphasized the need to comply with medication regimen.  Type 2 diabetes mellitus: Controlled with A1c of 7.3 from 06/2015, CBG is 104 today. Continue current management.  Chronic pain/spinal stenosis: I have educated her about the pain policy of the clinic and have referred her to pain management. I will. For the short-term prescription of Tylenol No. 3 which she states she has taken in the past with no issues even though her chart reveals she is allergic to tramadol.  CHF: EF 55-60% No  evidence of fluid overload.  Schizophrenia/bipolar disorder: I have called in LCSW for extensive counseling session with her and she will be referred to [facility] for management mental health issues.  Constipation: Placed on lactulose and titrate modifications discussed including increasing fiber intake and water.  Seizures: Remains on keppra She was previously followed by neurology while in [place] and so I have referred her to neurology.  She has multiple other health care needs which include and I have spoken with the case manager will be linking her up with home care services for evaluation for hospital bed, PCS services and nursing services given she is tender Due to her blindness. I have also completed a form for handicap placard.      Arnoldo Morale, [facility] [facility] [phone] 07/01/2015, 8:45 AM

## 2015-07-02 ENCOUNTER — Telehealth: Payer: Self-pay

## 2015-07-02 ENCOUNTER — Telehealth: Payer: Self-pay | Admitting: Clinical

## 2015-07-02 ENCOUNTER — Other Ambulatory Visit: Payer: Self-pay | Admitting: Family Medicine

## 2015-07-02 DIAGNOSIS — I5033 Acute on chronic diastolic (congestive) heart failure: Secondary | ICD-10-CM

## 2015-07-02 DIAGNOSIS — I509 Heart failure, unspecified: Secondary | ICD-10-CM | POA: Insufficient documentation

## 2015-07-02 DIAGNOSIS — R569 Unspecified convulsions: Secondary | ICD-10-CM

## 2015-07-02 DIAGNOSIS — H548 Legal blindness, as defined in USA: Secondary | ICD-10-CM

## 2015-07-02 DIAGNOSIS — G894 Chronic pain syndrome: Secondary | ICD-10-CM

## 2015-07-02 NOTE — Telephone Encounter (Signed)
CMA called pt, pt verified name and DOB. Pt was given lab results.

## 2015-07-02 NOTE — Telephone Encounter (Signed)
-----   Message from Arnoldo Morale, MD sent at 07/01/2015  9:14 AM EDT ----- She has some degree of microalbuminuria which is secondary to diabetes otherwise all other labs are stable.

## 2015-07-02 NOTE — Telephone Encounter (Signed)
Dr. Jarold Song referred patient for home health services [facility] RN (for medication and symptom management)/PT (for home safety evaluation)/HHA.  Referral in EPIC.  Placed call to patient to determine if she had a preferred home health company. Patient indicated she did not have a preferred company. Call placed to [facility] to determine if they could provide home health services for patient. Was informed the earliest start of care would be 07/07/15 with [place].  Also placed call to [facility] and this Case Manager was informed they could provide a start of care on 07/05/15 or 07/06/15.  Requested information faxed to [facility] [phone]).   Request for Independent Assessment for [place] form also completed by Dr. Jarold Song and faxed to [facility] [phone]). Patient updated on all above information. Patient verbalized understanding and appreciative.

## 2015-07-02 NOTE — Telephone Encounter (Signed)
Returned pt call requesting refills on insulin, lisinopril, advair, imdur, plavix, meclizine, risperdal; also requesting orders for hospital bed, toilet chair, and blood pressure cuff. Kathy Ellis currently experiencing gas and sore chest, and wants to know if these are side effects of her medication. She would also like to know where she can get low-cost rabies vaccine for her emotional support dog Kathy Ellis, as well as emotional support paperwork and vest for dogs in [place]. She may take Kathy Ellis to the [facility], at [employer] in [place], [place] on Fridays 8-12 and 1-4 or Saturdays 9-12 for $10 rabies shots, and is encouraged to contact [employer] at [phone] for any requirements for emotional support animals in [place]. Kathy Ellis advised that she will hear back from a nurse or provider by Monday, 07-05-15, at the latest, with the results of her request for the above DME and medications, and is agreeable.

## 2015-07-05 ENCOUNTER — Other Ambulatory Visit: Payer: Self-pay | Admitting: Family Medicine

## 2015-07-05 ENCOUNTER — Telehealth: Payer: Self-pay

## 2015-07-05 MED ORDER — MECLIZINE HCL 25 MG PO TABS: 25.0000 mg | ORAL_TABLET | Freq: Two times a day (BID) | ORAL | Status: DC | PRN

## 2015-07-05 MED ORDER — ALBUTEROL SULFATE HFA 108 (90 BASE) MCG/ACT IN AERS: 2.0000 | INHALATION_SPRAY | Freq: Four times a day (QID) | RESPIRATORY_TRACT | Status: DC | PRN

## 2015-07-05 MED ORDER — FUROSEMIDE 20 MG PO TABS: 20.0000 mg | ORAL_TABLET | Freq: Every day | ORAL | Status: DC

## 2015-07-05 MED ORDER — CLOPIDOGREL BISULFATE 75 MG PO TABS: 75.0000 mg | ORAL_TABLET | Freq: Every day | ORAL | Status: DC

## 2015-07-05 MED ORDER — ISOSORBIDE MONONITRATE ER 30 MG PO TB24: 30.0000 mg | ORAL_TABLET | Freq: Every day | ORAL | Status: DC

## 2015-07-05 MED ORDER — IPRATROPIUM-ALBUTEROL 0.5-2.5 (3) MG/3ML IN SOLN: 3.0000 mL | Freq: Four times a day (QID) | RESPIRATORY_TRACT | Status: AC | PRN

## 2015-07-05 MED ORDER — INSULIN NPH ISOPHANE & REGULAR (70-30) 100 UNIT/ML ~~LOC~~ SUSP: 15.0000 [IU] | Freq: Three times a day (TID) | SUBCUTANEOUS | Status: DC

## 2015-07-05 MED ORDER — GABAPENTIN 300 MG PO CAPS: 300.0000 mg | ORAL_CAPSULE | Freq: Three times a day (TID) | ORAL | Status: DC

## 2015-07-05 NOTE — Telephone Encounter (Signed)
Call placed to [facility] [employer]) # [phone] and spoke to [facility], Buyer, retail.  Informed her of the patient's request for a hospital bed, bedside commode and BP cuff.  Lexine Baton stated that they will have the nurse evaluate the patient for the bed and commode tomorrow when the initial visit is made.  Nikki noted that they do not supply BP cuffs.   Call then placed to the patient and informed her that the nurse who comes to see her tomorrow from [facility] will assess her for the bed and commode.  They do not provide BP cuffs and she would therefore need to purchase one one.  Also notified her to call the pharmacy before coming to pick up her medication refills.  She was appreciative of the call.   Update provided to Dr Jarold Song.

## 2015-07-13 ENCOUNTER — Other Ambulatory Visit: Payer: Self-pay

## 2015-07-13 ENCOUNTER — Telehealth: Payer: Self-pay

## 2015-07-13 MED ORDER — LEVETIRACETAM 500 MG PO TABS: 1500.0000 mg | ORAL_TABLET | Freq: Two times a day (BID) | ORAL | Status: DC

## 2015-07-13 MED ORDER — LISINOPRIL 10 MG PO TABS: 10.0000 mg | ORAL_TABLET | Freq: Every day | ORAL | Status: DC

## 2015-07-13 MED ORDER — SIMVASTATIN 20 MG PO TABS: 20.0000 mg | ORAL_TABLET | Freq: Every day | ORAL | Status: DC

## 2015-07-13 NOTE — Telephone Encounter (Signed)
Call placed to the patient to check on her status and to remind her of her appointment tomorrow, 07/14/15 @ 1515.  She said that she would be at the appointment and has transportation.   She said that she has been coughing and experienced some shortness of breath at times. No shortness of breath reported at the time of the CM call.  She said that she has been using her nebulizer treatments twice a day when needed and that has caused her to cough but she did not report expectorating any mucus.  Reviewed with her the orders for the nebulizer treatments, noting that they can be administered every 6 hours as needed. She noted that the "other day" she may have had a fever, stating that her husband used the back of his hand to feel her head. He did not use a thermometer and she did not report that she feels that she had a fever today.   She said that her glucometer is broken and she has not been able to check her blood sugars. She then stated that her visiting nurse is coming to see her this afternoon and she can check her blood sugar.  She also reported that the visiting nurse is going to order the hospital bed and [facility] for her.   She noted  that she needs refills for her furosemide, keppra and lisinopril.  Instructed her about the importance of medication compliance.   Alene Mires, LPN re-ordered the keppra and lisinopril for the patient and sent the orders to [facility] pharmacy.The patient has an order for furosemide at [facility].   This CM then called the patient back to inform her that her medications were re-ordered and she can pick them up at the [facility] pharmacy; but she should call before coming to make sure that they are ready. She verbalized understanding and stated that her husband may be able to pick them up today. The patient also stated that her visiting nurse had just arrived and she forgot to tell this CM that she also needed simvastatin.   Alene Mires, LPN re-ordered the simvastatin and sent the  order to the [facility].  Opal Sidles, pharmacist at [facility] pharmacy then informed this CM that the furosemide is on hold because it was filled at another pharmacy.  This CM called the patient again to inform her that the simvastatin, keppra and lisinopril have been ordered at [facility]. The furosemide was filled at another pharmacy.  The patient said she will have her husband check but it was probably filled at [facility].  Instructed her to bring all of her medications to her appointment tomorrow and informed her that she could discuss the need for a new glucometer with the doctor at that time  and she verbalized understanding.  She reported no other problems/ questions and stated that she appreciated the call.

## 2015-07-14 ENCOUNTER — Ambulatory Visit: Payer: Medicaid Other | Attending: Family Medicine | Admitting: Family Medicine

## 2015-07-14 ENCOUNTER — Encounter: Payer: Self-pay | Admitting: Family Medicine

## 2015-07-14 ENCOUNTER — Telehealth: Payer: Self-pay

## 2015-07-14 VITALS — BP 118/84 | HR 104 | Temp 98.2°F | Resp 16 | Ht 67.0 in | Wt 269.0 lb

## 2015-07-14 DIAGNOSIS — J449 Chronic obstructive pulmonary disease, unspecified: Secondary | ICD-10-CM | POA: Diagnosis not present

## 2015-07-14 DIAGNOSIS — Z880 Allergy status to penicillin: Secondary | ICD-10-CM | POA: Diagnosis not present

## 2015-07-14 DIAGNOSIS — R569 Unspecified convulsions: Secondary | ICD-10-CM | POA: Insufficient documentation

## 2015-07-14 DIAGNOSIS — Z8701 Personal history of pneumonia (recurrent): Secondary | ICD-10-CM | POA: Insufficient documentation

## 2015-07-14 DIAGNOSIS — Z7982 Long term (current) use of aspirin: Secondary | ICD-10-CM | POA: Insufficient documentation

## 2015-07-14 DIAGNOSIS — F17210 Nicotine dependence, cigarettes, uncomplicated: Secondary | ICD-10-CM | POA: Insufficient documentation

## 2015-07-14 DIAGNOSIS — J438 Other emphysema: Secondary | ICD-10-CM

## 2015-07-14 DIAGNOSIS — H548 Legal blindness, as defined in USA: Secondary | ICD-10-CM | POA: Diagnosis not present

## 2015-07-14 DIAGNOSIS — I110 Hypertensive heart disease with heart failure: Secondary | ICD-10-CM | POA: Insufficient documentation

## 2015-07-14 DIAGNOSIS — I5033 Acute on chronic diastolic (congestive) heart failure: Secondary | ICD-10-CM | POA: Diagnosis not present

## 2015-07-14 DIAGNOSIS — K5900 Constipation, unspecified: Secondary | ICD-10-CM | POA: Insufficient documentation

## 2015-07-14 DIAGNOSIS — E119 Type 2 diabetes mellitus without complications: Secondary | ICD-10-CM

## 2015-07-14 DIAGNOSIS — F319 Bipolar disorder, unspecified: Secondary | ICD-10-CM | POA: Insufficient documentation

## 2015-07-14 DIAGNOSIS — J189 Pneumonia, unspecified organism: Secondary | ICD-10-CM | POA: Diagnosis not present

## 2015-07-14 DIAGNOSIS — Z91040 Latex allergy status: Secondary | ICD-10-CM | POA: Diagnosis not present

## 2015-07-14 DIAGNOSIS — Z79899 Other long term (current) drug therapy: Secondary | ICD-10-CM | POA: Diagnosis not present

## 2015-07-14 DIAGNOSIS — G894 Chronic pain syndrome: Secondary | ICD-10-CM

## 2015-07-14 DIAGNOSIS — I5030 Unspecified diastolic (congestive) heart failure: Secondary | ICD-10-CM | POA: Diagnosis not present

## 2015-07-14 DIAGNOSIS — F209 Schizophrenia, unspecified: Secondary | ICD-10-CM | POA: Insufficient documentation

## 2015-07-14 DIAGNOSIS — G8929 Other chronic pain: Secondary | ICD-10-CM | POA: Insufficient documentation

## 2015-07-14 DIAGNOSIS — Z794 Long term (current) use of insulin: Secondary | ICD-10-CM | POA: Insufficient documentation

## 2015-07-14 DIAGNOSIS — F3160 Bipolar disorder, current episode mixed, unspecified: Secondary | ICD-10-CM

## 2015-07-14 DIAGNOSIS — Z590 Homelessness: Secondary | ICD-10-CM | POA: Insufficient documentation

## 2015-07-14 LAB — GLUCOSE, POCT (MANUAL RESULT ENTRY): POC Glucose: 141 mg/dl — AB (ref 70–99)

## 2015-07-14 MED ORDER — MECLIZINE HCL 25 MG PO TABS: 25.0000 mg | ORAL_TABLET | Freq: Two times a day (BID) | ORAL | Status: DC | PRN

## 2015-07-14 MED ORDER — LISINOPRIL 10 MG PO TABS: 10.0000 mg | ORAL_TABLET | Freq: Every day | ORAL | Status: DC

## 2015-07-14 MED ORDER — ACCU-CHEK AVIVA DEVI: Status: AC

## 2015-07-14 MED ORDER — FUROSEMIDE 20 MG PO TABS: 20.0000 mg | ORAL_TABLET | Freq: Every day | ORAL | Status: DC

## 2015-07-14 MED ORDER — ACCU-CHEK SOFTCLIX LANCET DEV MISC: Status: AC

## 2015-07-14 MED ORDER — LEVETIRACETAM 500 MG PO TABS: 1500.0000 mg | ORAL_TABLET | Freq: Two times a day (BID) | ORAL | Status: DC

## 2015-07-14 MED ORDER — GLUCOSE BLOOD VI STRP
ORAL_STRIP | Status: AC
Start: 2015-07-14 — End: ?

## 2015-07-14 NOTE — Patient Instructions (Signed)
Diabetes Mellitus and Food It is important for you to manage your blood sugar (glucose) level. Your blood glucose level can be greatly affected by what you eat. Eating healthier foods in the appropriate amounts throughout the day at about the same time each day will help you control your blood glucose level. It can also help slow or prevent worsening of your diabetes mellitus. Healthy eating may even help you improve the level of your blood pressure and reach or maintain a healthy weight.  General recommendations for healthful eating and cooking habits include:  Eating meals and snacks regularly. Avoid going long periods of time without eating to lose weight.  Eating a diet that consists mainly of plant-based foods, such as fruits, vegetables, nuts, legumes, and whole grains.  Using low-heat cooking methods, such as baking, instead of high-heat cooking methods, such as deep frying. Work with your dietitian to make sure you understand how to use the Nutrition Facts information on food labels. HOW CAN FOOD AFFECT ME? Carbohydrates Carbohydrates affect your blood glucose level more than any other type of food. Your dietitian will help you determine how many carbohydrates to eat at each meal and teach you how to count carbohydrates. Counting carbohydrates is important to keep your blood glucose at a healthy level, especially if you are using insulin or taking certain medicines for diabetes mellitus. Alcohol Alcohol can cause sudden decreases in blood glucose (hypoglycemia), especially if you use insulin or take certain medicines for diabetes mellitus. Hypoglycemia can be a life-threatening condition. Symptoms of hypoglycemia (sleepiness, dizziness, and disorientation) are similar to symptoms of having too much alcohol.  If your health care provider has given you approval to drink alcohol, do so in moderation and use the following guidelines:  Women should not have more than one drink per day, and men  should not have more than two drinks per day. One drink is equal to:  12 oz of beer.  5 oz of wine.  1 oz of hard liquor.  Do not drink on an empty stomach.  Keep yourself hydrated. Have water, diet soda, or unsweetened iced tea.  Regular soda, juice, and other mixers might contain a lot of carbohydrates and should be counted. WHAT FOODS ARE NOT RECOMMENDED? As you make food choices, it is important to remember that all foods are not the same. Some foods have fewer nutrients per serving than other foods, even though they might have the same number of calories or carbohydrates. It is difficult to get your body what it needs when you eat foods with fewer nutrients. Examples of foods that you should avoid that are high in calories and carbohydrates but low in nutrients include:  Trans fats (most processed foods list trans fats on the Nutrition Facts label).  Regular soda.  Juice.  Candy.  Sweets, such as cake, pie, doughnuts, and cookies.  Fried foods. WHAT FOODS CAN I EAT? Eat nutrient-rich foods, which will nourish your body and keep you healthy. The food you should eat also will depend on several factors, including:  The calories you need.  The medicines you take.  Your weight.  Your blood glucose level.  Your blood pressure level.  Your cholesterol level. You should eat a variety of foods, including:  Protein.  Lean cuts of meat.  Proteins low in saturated fats, such as fish, egg whites, and beans. Avoid processed meats.  Fruits and vegetables.  Fruits and vegetables that may help control blood glucose levels, such as apples, mangoes, and   yams.  Dairy products.  Choose fat-free or low-fat dairy products, such as milk, yogurt, and cheese.  Grains, bread, pasta, and rice.  Choose whole grain products, such as multigrain bread, whole oats, and brown rice. These foods may help control blood pressure.  Fats.  Foods containing healthful fats, such as nuts,  avocado, olive oil, canola oil, and fish. DOES EVERYONE WITH DIABETES MELLITUS HAVE THE SAME MEAL PLAN? Because every person with diabetes mellitus is different, there is not one meal plan that works for everyone. It is very important that you meet with a dietitian who will help you create a meal plan that is just right for you.   This information is not intended to replace advice given to you by your health care provider. Make sure you discuss any questions you have with your health care provider.   Document Released: 05/25/2005 Document Revised: 09/18/2014 Document Reviewed: 07/25/2013 Elsevier Interactive Patient Education 2016 Elsevier Inc.  

## 2015-07-14 NOTE — Progress Notes (Signed)
Pt's here for f/up DM and HTN.   Pt reports taking her BP meds and insulin  Refills on meds. Pt reports taking her meds

## 2015-07-14 NOTE — Telephone Encounter (Signed)
At the request of Dr Jarold Song, met with the patient at the clinic to discuss the status of her home care services.  She said that she has a Marine scientist and P.T.that see her from [facility].   She then stated that a nurse, Tiffany, from [facility] came to the house to assess her and informed her that she did not qualify for [facility] services because she has "standard medicaid."  This CM offered to call [facility] to clarify the status of the [facility] services.  The patient then noted that she had medicaid and received PCS in other states that she lived in the past.  This CM informed her that medicaid eligibility and services offered vary from state to state and she verbalized understanding.    A call was placed to [facility] at 1650 and the office was closed for the day.  CM attempt to contact [facility] again tomorrow.   Update provided to Dr Jarold Song.

## 2015-07-14 NOTE — Progress Notes (Signed)
Subjective:    Patient ID: Kathy Ellis, female    DOB: [birth year], 44 y.o.   MRN: [id number]  HPI  44 year old legally blind patient with a history of controlled type 2 diabetes mellitus (A1c 7.3), seizures, COPD, hypertension, CHF, spinal stenosis and chronic pain who was recently hospitalized for community acquired pneumonia and completed a course of Levaquin and reports doing better.  At her last office visit I had added Advair to her regimen and she had received a nebulizer machine which helps with the COPD symptoms. Her blood pressure which was previously uncontrolled is better today. She was referred to pain management and has an upcoming appointment later this month for management of spinal stenosis and she will be seeing urology next month for management of her seizures.  She has no complaints at this time.  Past Medical History  Diagnosis Date  . Hypertension   . Diabetes mellitus without complication ([place])   . COPD (chronic obstructive pulmonary disease) ([place])   . CHF (congestive heart failure) ([place])   . Seizures ([place])   . Cancer ([place])   . Homelessness   . Deaf   . Legally blind     bilaterally     Past Surgical History  Procedure Laterality Date  . Brain surgery    . Cholecystectomy    . Tubal ligation      Social History   Social History  . Marital Status: Single    Spouse Name: N/A  . Number of Children: N/A  . Years of Education: N/A   Occupational History  . Not on file.   Social History Main Topics  . Smoking status: Current Every Day Smoker -- 1.00 packs/day for 34 years    Types: Cigarettes  . Smokeless tobacco: Never Used  . Alcohol Use: No  . Drug Use: No  . Sexual Activity: Not on file   Other Topics Concern  . Not on file   Social History Narrative    Allergies  Allergen Reactions  . Amoxicillin Anaphylaxis    Has patient had a PCN reaction causing immediate rash, facial/tongue/throat swelling, SOB or lightheadedness with  hypotension: Yes Has patient had a PCN reaction causing severe rash involving mucus membranes or skin necrosis: Yes Has patient had a PCN reaction that required hospitalization Yes Has patient had a PCN reaction occurring within the last 10 years: No If all of the above answers are "NO", then may proceed with Cephalosporin use.   . Ibuprofen Anaphylaxis  . Latex Hives and Swelling  . Penicillins Anaphylaxis    Has patient had a PCN reaction causing immediate rash, facial/tongue/throat swelling, SOB or lightheadedness with hypotension: Yes Has patient had a PCN reaction causing severe rash involving mucus membranes or skin necrosis: Yes Has patient had a PCN reaction that required hospitalization Yes Has patient had a PCN reaction occurring within the last 10 years: yes If all of the above answers are "NO", then may proceed with Cephalosporin use.   . Tramadol Anaphylaxis  . Azithromycin Hives  . Depakote [Divalproex Sodium] Other (See Comments)    hallucinations  . Lactose Intolerance (Gi) Nausea And Vomiting  . Latex Hives and Swelling  . Prednisone Hives  . Haldol [Haloperidol Lactate]     Leg swelling  . Ibuprofen Hives  . Prednisone Hives    Current Outpatient Prescriptions on File Prior to Visit  Medication Sig Dispense Refill  . acetaminophen-codeine (TYLENOL #3) 300-30 MG tablet Take 1 tablet by mouth every 8 (eight)  hours as needed for moderate pain. 60 tablet 0  . albuterol (PROVENTIL HFA;VENTOLIN HFA) 108 (90 BASE) MCG/ACT inhaler Inhale 2 puffs into the lungs every 6 (six) hours as needed for wheezing or shortness of breath. 1 each 2  . alprazolam (XANAX) 2 MG tablet Take 2 mg by mouth 3 (three) times daily.    Marland Kitchen aspirin EC 81 MG tablet Take 81 mg by mouth every morning.    . clopidogrel (PLAVIX) 75 MG tablet Take 1 tablet (75 mg total) by mouth daily. 30 tablet 2  . diphenhydrAMINE (SOMINEX) 25 MG tablet Take 25 mg by mouth daily as needed for itching, allergies or  sleep.    Marland Kitchen Fluticasone-Salmeterol (ADVAIR) 100-50 MCG/DOSE AEPB Inhale 1 puff into the lungs 2 (two) times daily. 1 each 3  . gabapentin (NEURONTIN) 300 MG capsule Take 1 capsule (300 mg total) by mouth 3 (three) times daily. 90 capsule 2  . insulin NPH-regular Human (NOVOLIN 70/30) (70-30) 100 UNIT/ML injection Inject 15-20 Units into the skin 3 (three) times daily. 15 units in am, 20 units around 4pm and 15 units at night 10 mL 2  . ipratropium-albuterol (DUONEB) 0.5-2.5 (3) MG/3ML SOLN Take 3 mLs by nebulization every 6 (six) hours as needed. 360 mL 2  . isosorbide mononitrate (IMDUR) 30 MG 24 hr tablet Take 1 tablet (30 mg total) by mouth daily. 30 tablet 2  . lactulose (CHRONULAC) 10 GM/15ML solution Take 15 mLs (10 g total) by mouth 3 (three) times daily. As needed 946 mL 1  . levofloxacin (LEVAQUIN) 500 MG tablet Take 1 tablet (500 mg total) by mouth daily. 4 tablet 0  . morphine (MSIR) 30 MG tablet Take 1 tablet (30 mg total) by mouth every 4 (four) hours as needed for severe pain. 15 tablet 0  . nitroGLYCERIN (NITROSTAT) 0.4 MG SL tablet Place 0.4 mg under the tongue every 5 (five) minutes as needed for chest pain.    Marland Kitchen ondansetron (ZOFRAN) 4 MG tablet Take 1 tablet (4 mg total) by mouth every 8 (eight) hours as needed for nausea or vomiting. 8 tablet 0  . phenytoin (DILANTIN) 100 MG ER capsule Take 3 capsules (300 mg total) by mouth at bedtime. 30 capsule 0  . promethazine-codeine (PHENERGAN WITH CODEINE) 6.25-10 MG/5ML syrup Take 5 mLs by mouth 2 (two) times daily as needed for cough.    . risperiDONE (RISPERDAL) 2 MG tablet Take 2 mg by mouth at bedtime.    . simvastatin (ZOCOR) 20 MG tablet Take 1 tablet (20 mg total) by mouth daily. 30 tablet 2  . traZODone (DESYREL) 100 MG tablet Take 200 mg by mouth at bedtime.    Marland Kitchen venlafaxine XR (EFFEXOR-XR) 75 MG 24 hr capsule Take 75 mg by mouth daily with breakfast.    . naproxen sodium (ANAPROX) 220 MG tablet Take 220 mg by mouth daily as  needed (for pain).    . polyethylene glycol (MIRALAX / GLYCOLAX) packet Take 17 g by mouth 2 (two) times daily.     No current facility-administered medications on file prior to visit.      Review of Systems  Constitutional: Negative for activity change, appetite change and fatigue.  HENT: Negative for congestion, sinus pressure and sore throat.   Eyes: Positive for visual disturbance (blindness).  Respiratory: Negative for cough, chest tightness, shortness of breath and wheezing.   Cardiovascular: Negative for chest pain and palpitations.  Gastrointestinal: Negative for abdominal pain, constipation and abdominal distention.  Endocrine: Negative  for polydipsia.  Genitourinary: Negative for dysuria and frequency.  Musculoskeletal: Positive for back pain. Negative for arthralgias.  Skin: Negative for rash.  Neurological: Negative for tremors, light-headedness and numbness.  Hematological: Does not bruise/bleed easily.  Psychiatric/Behavioral: Negative for behavioral problems and agitation.       Objective: Filed Vitals:   07/14/15 1534  BP: 118/84  Pulse: 104  Temp: 98.2 F (36.8 C)  TempSrc: Oral  Resp: 16  Height: 5\' 7"  (1.702 m)  Weight: 269 lb (122.018 kg)  SpO2: 97%      Physical Exam  Constitutional: She is oriented to person, place, and time. She appears well-developed and well-nourished. No distress.  HENT:  Head: Normocephalic.  Right Ear: External ear normal.  Left Ear: External ear normal.  Nose: Nose normal.  Mouth/Throat: Oropharynx is clear and moist.  Eyes: Conjunctivae and EOM are normal. Pupils are equal, round, and reactive to light.  Neck: Normal range of motion. No JVD present.  Cardiovascular: Regular rhythm, normal heart sounds and intact distal pulses.  Tachycardia present.  Exam reveals no gallop.   No murmur heard. Pulmonary/Chest: Effort normal and breath sounds normal. No respiratory distress. She has no wheezes. She has no rales. She  exhibits no tenderness.  Abdominal: Soft. Bowel sounds are normal. She exhibits no distension and no mass. There is no tenderness.  Musculoskeletal: Normal range of motion. She exhibits no edema or tenderness.  Neurological: She is alert and oriented to person, place, and time. She has normal reflexes.  Skin: Skin is warm and dry. She is not diaphoretic.  Psychiatric: She has a normal mood and affect.          Assessment & Plan:  Community-acquired pneumonia: Completed course of Levaquin. Resolved  COPD: Doing well on Advair. Ordered nebulizer machine as well as nebulizer solution for her at her last visit   Hypertension: Controlled  Type 2 diabetes mellitus: Controlled with A1c of 7.3 from 06/2015, CBG 141. Continue current management.  Chronic pain/spinal stenosis: I have educated her about the pain policy of the clinic and have referred her to pain management. I gave her short-term prescription of Tylenol No. 3 which she states she has taken in the past with no issues even though her chart reveals she is allergic to tramadolAppt with pain clinic on 40/3/47  Diastolic CHF: EF 42-59% No evidence of fluid overload.  Schizophrenia/bipolar disorder: I have called in LCSW for extensive counseling session with her and she will be referred to [facility] for management mental health issues.  Constipation: Improved on lactulose  Seizures: Remains on keppra She was previously followed by neurology while in [place] and so I have referred her to neurology.  She has multiple other health care needs which include and I have spoken with the case manager will be linking her up with home care services for evaluation for hospital bed, PCS services and nursing services due to her blindness.

## 2015-07-15 ENCOUNTER — Telehealth: Payer: Self-pay

## 2015-07-15 DIAGNOSIS — F3160 Bipolar disorder, current episode mixed, unspecified: Secondary | ICD-10-CM | POA: Insufficient documentation

## 2015-07-15 NOTE — Telephone Encounter (Signed)
Call placed to [employer] to [phone] to clarify status of patient's PCS services as patient told Eden Lathe, Transitional Care Case Manager, on 07/14/15 that she did not qualify for [facility] services because she had standard Medicaid. Unable to clarify this with [employer] as their computer systems were down. Will follow-up with [employer] at a later time.  In addition, call placed to patient to inform her that glucometer, glucose strips, and lancets ordered on 07/14/15.  Patient previously informed Eden Lathe that her glucometer was broken.  Patient verbalized understanding and appreciative of information. No additional concerns/issues identified.

## 2015-07-15 NOTE — Telephone Encounter (Signed)
Placed an additional call to [place] to check on status of patient's PCS services assessment. Spoke with Claudine who indicated patient's assessment was completed on 07/13/15. Per Claudine, patient was not declined to have PCS services. She indicated [employer] was waiting on patient to provide 3 home care agency choices.  She indicated once choices were made assessment could be completed and it would be determined amount of PCS services patient can get.    Call placed to patient to provide update. Informed patient to call [employer] and inform them of her top 3 home care agency choices. Gave patient [employer]. Patient verbalized understanding. No additional needs/concerns identified.

## 2015-07-29 ENCOUNTER — Telehealth: Payer: Self-pay

## 2015-07-29 NOTE — Telephone Encounter (Signed)
Call placed to check patient's status. Patient's spouse answered the phone and indicated he would inform patient to return call to this Case Manager when possible.

## 2015-07-30 ENCOUNTER — Telehealth: Payer: Self-pay

## 2015-07-30 ENCOUNTER — Telehealth: Payer: Self-pay | Admitting: Family Medicine

## 2015-07-30 NOTE — Telephone Encounter (Signed)
Received voicemail from patient returning Case Manager's call.  Placed an additional call to patient to check on status. Patient's spouse answered phone and indicated patient not with him; HIPPA compliant message left requesting return call.

## 2015-07-30 NOTE — Telephone Encounter (Signed)
Pt. Called stating that she has a bad cough and would like for her PCP to prescribe her promethazine. Please f/u with pt.

## 2015-08-02 NOTE — Telephone Encounter (Signed)
Pt. Called stating that she has a bad cough and would like for her PCP to prescribe her promethazine. Please f/u with pt.

## 2015-08-03 ENCOUNTER — Telehealth: Payer: Self-pay | Admitting: General Practice

## 2015-08-03 NOTE — Telephone Encounter (Signed)
Pt. Called stating that she is on her menstrual cycle and would like to know is she should still be taking clopidogrel (PLAVIX) 75 MG tablet. Please f/u with pt.

## 2015-08-03 NOTE — Telephone Encounter (Signed)
Patient states that she is heavy menstrual bleeding. Patient states that she is taking Plavix which is a blood thinner and believes that the medicine may be causing her heavy bleeding Patient would also like to know if she can be prescribed Promethazine. Please follow up with patient.

## 2015-08-09 ENCOUNTER — Telehealth: Payer: Self-pay | Admitting: General Practice

## 2015-08-09 NOTE — Telephone Encounter (Signed)
Patient called requesting to speak with Janett Billow,  please follow up  With patient.

## 2015-08-11 ENCOUNTER — Ambulatory Visit: Payer: Self-pay | Admitting: Neurology

## 2015-08-11 NOTE — Telephone Encounter (Signed)
CMA called patient, patient verified name and DOB. Pt is concern about the issues listed in receptionist notes. I informed patient that I would reach out to Dr. Jarold Song and she what she suggest. Patient is awaiting my call.

## 2015-08-12 ENCOUNTER — Other Ambulatory Visit: Payer: Self-pay | Admitting: *Deleted

## 2015-08-12 ENCOUNTER — Telehealth: Payer: Self-pay

## 2015-08-12 NOTE — Telephone Encounter (Signed)
Call placed to patient to determine status and to remind patient of appointment with Dr. Jarold Song on 08/13/15 at 1000.  Unable to reach patient; voicemail left requesting return call.

## 2015-08-13 ENCOUNTER — Ambulatory Visit: Payer: Self-pay | Admitting: Family Medicine

## 2015-08-13 NOTE — Telephone Encounter (Signed)
Attempted to call patient to check in to see if she was still having heaving vaginal bleeding.  She missed her MD appointment today but has rescheduled for 08/19/15.  Husband answered and stated patient was not home.  He wanted me to give him the information saying that his wife had signed a release that we could speak to him.  I was unable to locate this documentation in the chart and explained and apologized that I could not legally speak with him regarding medical information.  He took my name and number and said he would have her call me.

## 2015-08-17 NOTE — Telephone Encounter (Signed)
Nurse Vicente Males followed up with the concerns that the patient had about the vaginal bleeding. Patient did not answer, but was left a message with her husband to return Vicente Males call. Patient has an appointment on at [facility] on 08/19/15, and all concerns will be address during OV, or prior to the appointment.

## 2015-08-19 ENCOUNTER — Ambulatory Visit: Payer: Self-pay | Admitting: Family Medicine

## 2015-08-24 ENCOUNTER — Telehealth: Payer: Self-pay | Admitting: Family Medicine

## 2015-08-24 DIAGNOSIS — G894 Chronic pain syndrome: Secondary | ICD-10-CM

## 2015-08-24 DIAGNOSIS — I5033 Acute on chronic diastolic (congestive) heart failure: Secondary | ICD-10-CM

## 2015-08-24 NOTE — Telephone Encounter (Signed)
Patient called stating that Crystal from [facility] is requesting a prescription for a hospital bed and a beside commode. Patient stated that her insurance approved it but the facility needs the prescriptions faxed to [phone].  For any questions please f/u with pt.

## 2015-08-25 ENCOUNTER — Other Ambulatory Visit: Payer: Self-pay | Admitting: Family Medicine

## 2015-08-25 DIAGNOSIS — I5032 Chronic diastolic (congestive) heart failure: Secondary | ICD-10-CM

## 2015-08-25 NOTE — Telephone Encounter (Signed)
Called patient and verified name and date of birth.  Patient states her [facility] therapist is recommending the hospital equipment she spoke about in her previous call.  This RN got [facility] phone number from patient and called.  RN spoke with Beth,RN [phone]), at [facility] who is an Production assistant, radio.  She looked through the therapist's notes regarding Ms. Lanza and verified that a hospital bed and bedside commode were warranted.  Orders placed and faxed to [facility] at [id number].  Patient called and told the orders were faxed.  Patient asked by RN how she is doing and was told that we had been trying to reach her to check on her status.  Patient states that she has been feeling poorly.  She believes she has had the flu.  She reports she has been to pain management and that she does have a follow up with Dr. Jarold Song later in the month.  RN gave patient phone number at work [phone] and told her to call if she had any problems.  Confirmed that we would see her at her next appointment that is scheduled on 09/01/15 at 1515.  Patient states she will be here.

## 2015-08-27 ENCOUNTER — Telehealth: Payer: Self-pay | Admitting: *Deleted

## 2015-08-27 MED ORDER — PROMETHAZINE-CODEINE 6.25-10 MG/5ML PO SYRP: 5.0000 mL | ORAL_SOLUTION | Freq: Two times a day (BID) | ORAL | Status: AC | PRN

## 2015-08-27 NOTE — Telephone Encounter (Signed)
Patient called in asking if she could have a prescription for Imitrex.  She states she has taken it in the past and has had a headache for 3 days that she cannot get rid of using Tylenol.   She is also asking for promethazine with codeine for her cough.  She states she was prescribed that as well in the past and it helped.  Patient has appointment on 09/01/15 but I told her I would route to the MD for consideration.  Patient uses pharmacy at the clinic.

## 2015-08-27 NOTE — Telephone Encounter (Signed)
Due to her cardiac history I am reluctant to place her on Imitrex; that is something we will have to discuss at her next office visit however I have refilled her cough medication which is controlled and will need to be picked up from the clinic.

## 2015-08-27 NOTE — Telephone Encounter (Signed)
Patient called in asking if she could have a prescription for Imitrex. She states she has taken it in the past and has had a headache for 3 days that she cannot get rid of using Tylenol.  She is also asking for promethazine with codeine for her cough. She states she was prescribed that as well in the past and it helped. Patient has appointment on 09/01/15 but I told her I would route to the MD for consideration. Patient uses pharmacy at the clinic.

## 2015-08-27 NOTE — Telephone Encounter (Signed)
Called patient to let her know her cough medication Rx will be at the front of the clinic and she can pick up.  Patient verbalized understanding.  Also explained to patient that the MD could not prescribe the Imitrex and that she would discuss her migraines at her appointment on Wednesday.  Patient in agreement.

## 2015-08-31 ENCOUNTER — Telehealth: Payer: Self-pay | Admitting: *Deleted

## 2015-08-31 NOTE — Telephone Encounter (Signed)
Home Health RN called in and left message regarding patient's CBG today being 300.  She also states patient needs something stronger for pain and suggested we put her back on morphine.  Her message states patient needs something for nausea.  She requested a call back.  This RN attempted to call her back but her voicemail states it is full.  Patient has appointment with Dr. Jarold Song tomorrow where concerns will be addressed.

## 2015-09-01 ENCOUNTER — Ambulatory Visit: Payer: Self-pay | Admitting: Family Medicine

## 2015-09-02 ENCOUNTER — Encounter: Payer: Self-pay | Admitting: *Deleted

## 2015-09-02 NOTE — Progress Notes (Signed)
Patient ID: Kathy Ellis, female   DOB: 05-24-1971, 44 y.o.   MRN: [id number]   RN asked by Dr. Jarold Song to contact [facility] regarding their fax asking for most recent office visit notes so that they could approve the patient for her hospital bed.  RN spoke to [employer] at [facility] who was told that patient has not had a recent visit and needs one in order for MD to address this.  She states she would call patient and let her know.

## 2015-09-08 ENCOUNTER — Ambulatory Visit: Payer: Self-pay | Admitting: Neurology

## 2015-09-14 MED FILL — FUROSEMIDE 20 MG TABLET: 20 | 30 days supply | Qty: 30 | Fill #1

## 2015-09-14 MED FILL — levETIRAcetam 500 MG TABS: 500 | 30 days supply | Qty: 180 | Fill #0

## 2015-09-14 MED FILL — CLOPIDOGREL 75 MG TABLET: 75 | 30 days supply | Qty: 30 | Fill #2

## 2015-09-14 MED FILL — TRAVEL SICKNESS 25 MG TAB C: 25 | 15 days supply | Qty: 30 | Fill #1

## 2015-09-17 ENCOUNTER — Other Ambulatory Visit: Payer: Self-pay

## 2015-09-17 ENCOUNTER — Telehealth: Payer: Self-pay

## 2015-09-17 ENCOUNTER — Emergency Department (HOSPITAL_COMMUNITY): Payer: Medicaid Other

## 2015-09-17 ENCOUNTER — Emergency Department (HOSPITAL_COMMUNITY)
Admission: EM | Admit: 2015-09-17 | Discharge: 2015-09-17 | Disposition: A | Discharge status: Home / Self Care | Payer: Medicaid Other | Attending: Emergency Medicine | Admitting: Emergency Medicine

## 2015-09-17 ENCOUNTER — Encounter (HOSPITAL_COMMUNITY): Payer: Self-pay | Admitting: *Deleted

## 2015-09-17 DIAGNOSIS — J449 Chronic obstructive pulmonary disease, unspecified: Secondary | ICD-10-CM | POA: Diagnosis not present

## 2015-09-17 DIAGNOSIS — R519 Headache, unspecified: Secondary | ICD-10-CM

## 2015-09-17 DIAGNOSIS — Z794 Long term (current) use of insulin: Secondary | ICD-10-CM | POA: Diagnosis not present

## 2015-09-17 DIAGNOSIS — Z91040 Latex allergy status: Secondary | ICD-10-CM | POA: Diagnosis not present

## 2015-09-17 DIAGNOSIS — I509 Heart failure, unspecified: Secondary | ICD-10-CM | POA: Diagnosis not present

## 2015-09-17 DIAGNOSIS — H9190 Unspecified hearing loss, unspecified ear: Secondary | ICD-10-CM | POA: Diagnosis not present

## 2015-09-17 DIAGNOSIS — Z880 Allergy status to penicillin: Secondary | ICD-10-CM | POA: Insufficient documentation

## 2015-09-17 DIAGNOSIS — R51 Headache: Secondary | ICD-10-CM | POA: Diagnosis not present

## 2015-09-17 DIAGNOSIS — E119 Type 2 diabetes mellitus without complications: Secondary | ICD-10-CM | POA: Diagnosis not present

## 2015-09-17 DIAGNOSIS — Z859 Personal history of malignant neoplasm, unspecified: Secondary | ICD-10-CM | POA: Insufficient documentation

## 2015-09-17 DIAGNOSIS — Z79899 Other long term (current) drug therapy: Secondary | ICD-10-CM | POA: Diagnosis not present

## 2015-09-17 DIAGNOSIS — Z7902 Long term (current) use of antithrombotics/antiplatelets: Secondary | ICD-10-CM | POA: Insufficient documentation

## 2015-09-17 DIAGNOSIS — I10 Essential (primary) hypertension: Secondary | ICD-10-CM | POA: Insufficient documentation

## 2015-09-17 DIAGNOSIS — Z590 Homelessness: Secondary | ICD-10-CM | POA: Insufficient documentation

## 2015-09-17 DIAGNOSIS — Z7982 Long term (current) use of aspirin: Secondary | ICD-10-CM | POA: Insufficient documentation

## 2015-09-17 DIAGNOSIS — R0602 Shortness of breath: Secondary | ICD-10-CM | POA: Diagnosis not present

## 2015-09-17 DIAGNOSIS — F17210 Nicotine dependence, cigarettes, uncomplicated: Secondary | ICD-10-CM | POA: Diagnosis not present

## 2015-09-17 DIAGNOSIS — H548 Legal blindness, as defined in USA: Secondary | ICD-10-CM | POA: Insufficient documentation

## 2015-09-17 LAB — BASIC METABOLIC PANEL
ANION GAP: 9 (ref 5–15)
BUN: 21 mg/dL — ABNORMAL HIGH (ref 6–20)
CALCIUM: 8.8 mg/dL — AB (ref 8.9–10.3)
CO2: 22 mmol/L (ref 22–32)
Chloride: 108 mmol/L (ref 101–111)
Creatinine, Ser: 1.06 mg/dL — ABNORMAL HIGH (ref 0.44–1.00)
GFR calc Af Amer: >60 mL/min (ref >60)
GFR calc non Af Amer: >60 mL/min (ref >60)
GLUCOSE: 151 mg/dL — AB (ref 65–99)
POTASSIUM: 3.6 mmol/L (ref 3.5–5.1)
Sodium: 139 mmol/L (ref 135–145)

## 2015-09-17 LAB — CBC WITH DIFFERENTIAL/PLATELET
BASOS ABS: 0 10*3/uL (ref 0.0–0.1)
Basophils Relative: 0 %
EOS ABS: 0.2 10*3/uL (ref 0.0–0.7)
EOS PCT: 2 %
HEMATOCRIT: 43.6 % (ref 36.0–46.0)
Hemoglobin: 14.3 g/dL (ref 12.0–15.0)
LYMPHS PCT: 30 %
Lymphs Abs: 3 10*3/uL (ref 0.7–4.0)
MCH: 28.4 pg (ref 26.0–34.0)
MCHC: 32.8 g/dL (ref 30.0–36.0)
MCV: 86.7 fL (ref 78.0–100.0)
MONO ABS: 0.6 10*3/uL (ref 0.1–1.0)
Monocytes Relative: 6 %
Neutro Abs: 6.3 10*3/uL (ref 1.7–7.7)
Neutrophils Relative %: 62 %
Platelets: 219 10*3/uL (ref 150–400)
RBC: 5.03 MIL/uL (ref 3.87–5.11)
RDW: 13.9 % (ref 11.5–15.5)
WBC: 10.1 10*3/uL (ref 4.0–10.5)

## 2015-09-17 LAB — I-STAT TROPONIN, ED: Troponin i, poc: 0.02 ng/mL (ref 0.00–0.08)

## 2015-09-17 LAB — BRAIN NATRIURETIC PEPTIDE: B NATRIURETIC PEPTIDE 5: 40.4 pg/mL (ref 0.0–100.0)

## 2015-09-17 MED ORDER — GABAPENTIN 300 MG PO CAPS: 300.0000 mg | ORAL_CAPSULE | Freq: Three times a day (TID) | ORAL | Status: AC

## 2015-09-17 MED ORDER — DIPHENHYDRAMINE HCL 25 MG PO CAPS
25.0000 mg | ORAL_CAPSULE | Freq: Once | ORAL | Status: AC
  Administered 2015-09-17: 25 mg via ORAL
  Filled 2015-09-17: qty 1

## 2015-09-17 MED ORDER — DIPHENHYDRAMINE HCL 50 MG/ML IJ SOLN
25.0000 mg | Freq: Once | INTRAMUSCULAR | Status: DC
  Filled 2015-09-17: qty 1

## 2015-09-17 MED ORDER — ACETAMINOPHEN-CODEINE #3 300-30 MG PO TABS: 1.0000 | ORAL_TABLET | Freq: Four times a day (QID) | ORAL | Status: DC | PRN

## 2015-09-17 MED ORDER — LISINOPRIL 10 MG PO TABS
10.0000 mg | ORAL_TABLET | Freq: Once | ORAL | Status: AC
  Administered 2015-09-17: 10 mg via ORAL
  Filled 2015-09-17: qty 1

## 2015-09-17 MED ORDER — FUROSEMIDE 20 MG PO TABS: 20.0000 mg | ORAL_TABLET | Freq: Every day | ORAL | Status: AC

## 2015-09-17 MED ORDER — LISINOPRIL 10 MG PO TABS: 10.0000 mg | ORAL_TABLET | Freq: Every day | ORAL | Status: DC

## 2015-09-17 MED ORDER — METOCLOPRAMIDE HCL 5 MG/ML IJ SOLN
10.0000 mg | Freq: Once | INTRAMUSCULAR | Status: AC
  Administered 2015-09-17: 10 mg via INTRAMUSCULAR

## 2015-09-17 MED ORDER — MECLIZINE HCL 25 MG PO TABS: 25.0000 mg | ORAL_TABLET | Freq: Two times a day (BID) | ORAL | Status: AC | PRN

## 2015-09-17 MED ORDER — ISOSORBIDE MONONITRATE ER 30 MG PO TB24: 30.0000 mg | ORAL_TABLET | Freq: Every day | ORAL | Status: AC

## 2015-09-17 MED ORDER — METOCLOPRAMIDE HCL 5 MG/ML IJ SOLN
10.0000 mg | INTRAMUSCULAR | Status: DC
  Filled 2015-09-17: qty 2

## 2015-09-17 MED ORDER — LEVETIRACETAM 500 MG PO TABS: 1500.0000 mg | ORAL_TABLET | Freq: Two times a day (BID) | ORAL | Status: AC

## 2015-09-17 MED ORDER — CLOPIDOGREL BISULFATE 75 MG PO TABS: 75.0000 mg | ORAL_TABLET | Freq: Every day | ORAL | Status: DC

## 2015-09-17 MED ORDER — SIMVASTATIN 20 MG PO TABS: 20.0000 mg | ORAL_TABLET | Freq: Every day | ORAL | Status: DC

## 2015-09-17 NOTE — Telephone Encounter (Signed)
Nurse called Colletta Maryland with [facility] to make [facility] aware of patient's prescriptions have been sent to pharmacy to be refilled and per [name], office manager, medications will be mailed out today.  Colletta Maryland explains EMS was called and patient is on the way to hospital.  Colletta Maryland is calling patient to touch base. Colletta Maryland will make patient aware of medications being mailed out today and patient needs to be seen.  Colletta Maryland is aware of [place] will arrange transportation for patient.  [facility] nurse will call patient to check availability for appointment and to make patient aware of medications being mailed.

## 2015-09-17 NOTE — ED Notes (Signed)
Pt presents via [facility] from home with c/o HTN, HA x 3 days and SOB x 1 day.  Pt has [place] aid who visited this AM and called EMS d/t elevated BP, 210/140.  Pt reports being out of lisinopril x 1 week, medication comes in the mail.  BP-212/128 manually with EMS, P-110, R-14 [id number].  Pt legally blind, seeing eye dog at bedside as well as husband.  Pt also reports chills and productive cough.

## 2015-09-17 NOTE — ED Notes (Signed)
Pt sleeping at this time.

## 2015-09-17 NOTE — Telephone Encounter (Signed)
Kathy Ellis with [facility] home care called nurse to make nurse aware of patient's Bp 210/140. Kathy Ellis, patient and patient's husband is on speaker speaking with [facility] nurse. Patient reports "feeling funny". Patient explains she feels like she can feel her heart beating out of her chest.  Patient is out of Plavix, lisinopril, keppra, and lasix. Patient only has 4 meclizine and gabapentin left, and 6 Zocor. Per patient's husband, [name], Glass blower/designer, told husband patient's medications would be mailed one month ago. Nurse advised [facility] nurse, patient and patient's husband to call 911. Kathy Ellis, patient's husband and patient is aware of importance of getting Blood Pressure under control at this time and agrees to call 911. [facility] nurse will send message to [place], office manager to see what needs to be done to get medications to patient as soon as possible.

## 2015-09-17 NOTE — ED Provider Notes (Signed)
CSN: [id number]     Arrival date & time 09/17/15  1300 History   First MD Initiated Contact with Patient 09/17/15 1312     Chief Complaint  Patient presents with  . Hypertension  . Headache  . Shortness of Breath     (Consider location/radiation/quality/duration/timing/severity/associated sxs/prior Treatment) Patient is a 45 y.o. female presenting with hypertension, headaches, and shortness of breath. The history is provided by the patient and medical records.  Hypertension Associated symptoms include coughing and headaches.  Headache Associated symptoms: cough   Shortness of Breath Associated symptoms: cough and headaches     45 year old female with history of hypertension, diabetes, COPD, congestive heart failure, seizure disorder, legally blind in both eyes, presenting to the ED for headache and hypertension.  Patient states she's had a headache for 3 days. States this has been unrelieved by Advil which usually controls her headaches. She states headache is generalized, throbbing in nature. Her home health aide noticed that her blood pressure was significantly elevated at 210/140.  Patient does report she has been out of her lisinopril for the past week. Her medication is usually delivered in the mail, however she is not sure why she has not received it yet. Patient denies any dizziness, confusion, numbness, weakness, tinnitus, changes in speech. She remains ambulatory with assistance of her seeing-eye dog. Patient also reports she has had some mildly increased shortness of breath from her baseline. She states her home health nurse was recently sick with a cold. She has had a productive cough with intermittently yellow/white sputum. She reports subjective chills, denies fever. No chest pain.  Patient is on aspirin and Plavix.  Patient hypertensive on arrival.  Past Medical History  Diagnosis Date  . Hypertension   . Diabetes mellitus without complication ([place])   . COPD (chronic  obstructive pulmonary disease) ([place])   . CHF (congestive heart failure) ([place])   . Seizures ([place])   . Cancer ([place])   . Homelessness   . Deaf   . Legally blind     bilaterally    Past Surgical History  Procedure Laterality Date  . Brain surgery    . Cholecystectomy    . Tubal ligation     No family history on file. Social History  Substance Use Topics  . Smoking status: Current Every Day Smoker -- 1.00 packs/day for 34 years    Types: Cigarettes  . Smokeless tobacco: Never Used  . Alcohol Use: No   OB History    Gravida Para Term Preterm AB TAB SAB Ectopic Multiple Living   1              Review of Systems  Respiratory: Positive for cough and shortness of breath.   Neurological: Positive for headaches.  All other systems reviewed and are negative.     Allergies  Amoxicillin; Ibuprofen; Latex; Penicillins; Tramadol; Azithromycin; Depakote; Lactose intolerance (gi); Latex; Prednisone; Haldol; Ibuprofen; Prednisone; and Seroquel  Home Medications   Prior to Admission medications   Medication Sig Start Date End Date Taking? Authorizing Provider  acetaminophen-codeine (TYLENOL #3) 300-30 MG tablet Take 1 tablet by mouth every 8 (eight) hours as needed for moderate pain. 06/30/15   Arnoldo Morale, MD  albuterol (PROVENTIL HFA;VENTOLIN HFA) 108 (90 BASE) MCG/ACT inhaler Inhale 2 puffs into the lungs every 6 (six) hours as needed for wheezing or shortness of breath. 07/05/15   Arnoldo Morale, MD  alprazolam Duanne Moron) 2 MG tablet Take 2 mg by mouth 3 (three) times daily.  Historical Provider, MD  aspirin EC 81 MG tablet Take 81 mg by mouth every morning.    Historical Provider, MD  Blood Glucose Monitoring Suppl (ACCU-CHEK AVIVA) device Use as instructed-3 times daily before meals. 07/14/15 07/13/16  Arnoldo Morale, MD  clopidogrel (PLAVIX) 75 MG tablet Take 1 tablet (75 mg total) by mouth daily. 09/17/15   Arnoldo Morale, MD  diphenhydrAMINE (SOMINEX) 25 MG tablet Take 25 mg by mouth daily  as needed for itching, allergies or sleep.    Historical Provider, MD  Fluticasone-Salmeterol (ADVAIR) 100-50 MCG/DOSE AEPB Inhale 1 puff into the lungs 2 (two) times daily. 06/30/15   Arnoldo Morale, MD  furosemide (LASIX) 20 MG tablet Take 1 tablet (20 mg total) by mouth daily. 09/17/15   Arnoldo Morale, MD  gabapentin (NEURONTIN) 300 MG capsule Take 1 capsule (300 mg total) by mouth 3 (three) times daily. 09/17/15   Arnoldo Morale, MD  glucose blood (ACCU-CHEK AVIVA) test strip Use as instructed-3 times daily before meals. 07/14/15   Arnoldo Morale, MD  insulin NPH-regular Human (NOVOLIN 70/30) (70-30) 100 UNIT/ML injection Inject 15-20 Units into the skin 3 (three) times daily. 15 units in am, 20 units around 4pm and 15 units at night 07/05/15   Arnoldo Morale, MD  ipratropium-albuterol (DUONEB) 0.5-2.5 (3) MG/3ML SOLN Take 3 mLs by nebulization every 6 (six) hours as needed. 07/05/15   Arnoldo Morale, MD  isosorbide mononitrate (IMDUR) 30 MG 24 hr tablet Take 1 tablet (30 mg total) by mouth daily. 09/17/15   Arnoldo Morale, MD  lactulose (CHRONULAC) 10 GM/15ML solution Take 15 mLs (10 g total) by mouth 3 (three) times daily. As needed 06/30/15   Arnoldo Morale, MD  Lancet Devices [facility]) lancets Use as instructed-3 times daily before meals. 07/14/15   Arnoldo Morale, MD  levETIRAcetam (KEPPRA) 500 MG tablet Take 3 tablets (1,500 mg total) by mouth 2 (two) times daily. 09/17/15   Arnoldo Morale, MD  levofloxacin (LEVAQUIN) 500 MG tablet Take 1 tablet (500 mg total) by mouth daily. 06/24/15   Charlynne Cousins, MD  lisinopril (PRINIVIL,ZESTRIL) 10 MG tablet Take 1 tablet (10 mg total) by mouth daily. 09/17/15   Arnoldo Morale, MD  meclizine (ANTIVERT) 25 MG tablet Take 1 tablet (25 mg total) by mouth 2 (two) times daily as needed for dizziness. 09/17/15   Arnoldo Morale, MD  morphine (MSIR) 30 MG tablet Take 1 tablet (30 mg total) by mouth every 4 (four) hours as needed for severe pain. 06/24/15   Charlynne Cousins, MD   naproxen sodium (ANAPROX) 220 MG tablet Take 220 mg by mouth daily as needed (for pain).    Historical Provider, MD  nitroGLYCERIN (NITROSTAT) 0.4 MG SL tablet Place 0.4 mg under the tongue every 5 (five) minutes as needed for chest pain.    Historical Provider, MD  ondansetron (ZOFRAN) 4 MG tablet Take 1 tablet (4 mg total) by mouth every 8 (eight) hours as needed for nausea or vomiting. 09/25/14   Rolland Porter, MD  phenytoin (DILANTIN) 100 MG ER capsule Take 3 capsules (300 mg total) by mouth at bedtime. 06/24/15   Charlynne Cousins, MD  polyethylene glycol [facility] / Floria Raveling) packet Take 17 g by mouth 2 (two) times daily.    Historical Provider, MD  promethazine-codeine (PHENERGAN WITH CODEINE) 6.25-10 MG/5ML syrup Take 5 mLs by mouth 2 (two) times daily as needed for cough. 08/27/15   Arnoldo Morale, MD  risperiDONE (RISPERDAL) 2 MG tablet Take 2 mg by mouth  at bedtime.    Historical Provider, MD  simvastatin (ZOCOR) 20 MG tablet Take 1 tablet (20 mg total) by mouth daily. 09/17/15   Arnoldo Morale, MD  TRAVEL SICKNESS 25 MG CHEW  07/05/15   Historical Provider, MD  traZODone (DESYREL) 100 MG tablet Take 200 mg by mouth at bedtime.    Historical Provider, MD  venlafaxine XR (EFFEXOR-XR) 75 MG 24 hr capsule Take 75 mg by mouth daily with breakfast.    Historical Provider, MD   BP 159/90 mmHg  Pulse 107  Temp(Src) 98.3 F (36.8 C) (Oral)  Resp 19  Ht 5\' 7"  (1.702 m)  Wt 118.389 kg  BMI 40.87 kg/m2  SpO2 98%  LMP 08/17/2015   Physical Exam  Constitutional: She is oriented to person, place, and time. She appears well-developed and well-nourished. No distress.  HENT:  Head: Normocephalic and atraumatic.  Right Ear: Tympanic membrane and ear canal normal.  Left Ear: Tympanic membrane and ear canal normal.  Nose: Nose normal.  Mouth/Throat: Uvula is midline, oropharynx is clear and moist and mucous membranes are normal. No oropharyngeal exudate, posterior oropharyngeal edema, posterior  oropharyngeal erythema or tonsillar abscesses.  Eyes: Conjunctivae are normal. Pupils are equal, round, and reactive to light.  Legally blind bilaterally  Neck: Normal range of motion. Neck supple.  Cardiovascular: Normal rate, regular rhythm and normal heart sounds.   Pulmonary/Chest: Effort normal and breath sounds normal. No respiratory distress. She has no wheezes.  Musculoskeletal: Normal range of motion. She exhibits no edema.  Neurological: She is alert and oriented to person, place, and time.  AAOx3, answering questions and following commands appropriately; equal strength UE and LE bilaterally; CN grossly intact; moves all extremities appropriately without ataxia; no focal neuro deficits or facial asymmetry appreciated  Skin: Skin is warm and dry. She is not diaphoretic.  Psychiatric: She has a normal mood and affect.  Nursing note and vitals reviewed.   ED Course  Procedures (including critical care time) Labs Review Labs Reviewed  BASIC METABOLIC PANEL - Abnormal; Notable for the following:    Glucose, Bld 151 (*)    BUN 21 (*)    Creatinine, Ser 1.06 (*)    Calcium 8.8 (*)    All other components within normal limits  CBC WITH DIFFERENTIAL/PLATELET  BRAIN NATRIURETIC PEPTIDE  I-STAT TROPOININ, ED    Imaging Review Dg Chest 2 View  09/17/2015  CLINICAL DATA:  Severe headache.  Hypertension EXAM: CHEST  2 VIEW COMPARISON:  06/21/2015 FINDINGS: Cardiac enlargement without heart failure. Resolution of bilateral airspace disease. Lungs are now clear without infiltrate effusion or mass lesion. IMPRESSION: No active cardiopulmonary disease. Electronically Signed   By: Franchot Gallo M.D.   On: 09/17/2015 14:25   Ct Head Wo Contrast  09/17/2015  CLINICAL DATA:  Severe headache.  High blood pressure. EXAM: CT HEAD WITHOUT CONTRAST TECHNIQUE: Contiguous axial images were obtained from the base of the skull through the vertex without intravenous contrast. COMPARISON:  Head CT  11/02/2014 FINDINGS: No acute intracranial hemorrhage. No focal mass lesion. No CT evidence of acute infarction. No midline shift or mass effect. No hydrocephalus. Basilar cisterns are patent. Polypoid mucosal thickening in the LEFT maxillary sinus. Mastoid air cells are. IMPRESSION: 1. No acute intracranial findings. 2. Polypoid lesion of the LEFT maxillary sinus likely represents benign sinus inflammation Electronically Signed   By: Suzy Bouchard M.D.   On: 09/17/2015 15:12   I have personally reviewed and evaluated these images and lab results  as part of my medical decision-making.   EKG Interpretation None      MDM   Final diagnoses:  Essential hypertension  Headache, unspecified headache type   45 year old female here with headache and hypertension. She's been off of her home lisinopril for approximately 1 week. Patient is afebrile, nontoxic in appearance. She is neurologically intact. She reports generalized headache. Her blood pressure at home was reported to be 210/140, however on arrival here is 165/96. Patient is currently on aspirin and Plavix. Given her anticoagulant use, will obtain CT head to rule out any intracranial pathology. Patient also reports cough and shortness of breath after sick contact with her nurse a few days ago because a cold. Will also obtain basic labs and chest x-ray.  Labwork is overall reassuring. CT head negative for acute findings aside from benign sinus inflammation. Chest x-ray is clear. Patient was given dose for home lisinopril as well as Benadryl and Reglan with improvement of her headache. Her blood pressure is now stable at 149/87.  Patient's HTN without evidence of end organ damage at this time.  Feel patient stable for discharge. Will continue her home lisinopril.  She requested a few tylenol #3 until she could see her doctor on Tuesday as these help with her headaches, feel this is reasonable so 12 tabs given.  FU on Tuesday as scheduled.  Discussed  plan with patient, he/she acknowledged understanding and agreed with plan of care.  Return precautions given for new or worsening symptoms.  Larene Pickett, PA-C 09/17/15 1929  Tanna Furry, MD 09/18/15 [phone]

## 2015-09-17 NOTE — Discharge Instructions (Signed)
Take the prescribed medication as directed.  Use caution when taking tylenol #3-- it can make you sleepy.  Do not take extra tylenol while taking this. Follow-up with your primary care physician on Tuesday as scheduled. Return to the ED for new or worsening symptoms.

## 2015-09-17 NOTE — Telephone Encounter (Signed)
In prior telephone call on 09/16/14 at 11:52am patient gave nurse permission to speak with husband, Legrand Como. Nurse called patient, Legrand Como answered telephone. Legrand Como verified  Per Legrand Como, patient has just arrived at the hospital. Legrand Como aware of prescriptions being refilled and will be mailed today. Michael requests nurse to hold medications at pharmacy and he will come pick medication up in approximately 1 hour.  Legrand Como aware of patient needing to be seen, patient has appointment Friday, January 13. Legrand Como would like patient to be seen sooner and will reschedule apportionment when he picks up medication today. Legrand Como aware of telling front office staff of transportation need for appointment and asking front office staff to make [facility] aware of appointment so transportation can be arranged.

## 2015-09-21 ENCOUNTER — Telehealth: Payer: Self-pay

## 2015-09-21 ENCOUNTER — Ambulatory Visit: Payer: Self-pay | Admitting: Family Medicine
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
## 2015-09-24 ENCOUNTER — Ambulatory Visit: Payer: Self-pay | Admitting: Family Medicine

## 2015-09-27 ENCOUNTER — Telehealth: Payer: Self-pay

## 2015-09-27 NOTE — Telephone Encounter (Signed)
Call placed to the patient to confirm her appointment for tomorrow and to inquire about transportation.  She said that she is aware of the appointment; but needs transportation.   Jacklynn Lewis, [facility] scheduler notified of the patient's need for transportation. She will contact [employer].

## 2015-09-28 ENCOUNTER — Encounter: Payer: Self-pay | Admitting: Clinical

## 2015-09-28 ENCOUNTER — Encounter (HOSPITAL_BASED_OUTPATIENT_CLINIC_OR_DEPARTMENT_OTHER): Payer: Medicaid Other | Admitting: Clinical

## 2015-09-28 ENCOUNTER — Telehealth: Payer: Self-pay

## 2015-09-28 ENCOUNTER — Ambulatory Visit: Payer: Medicaid Other | Attending: Family Medicine | Admitting: Family Medicine

## 2015-09-28 VITALS — BP 134/94 | HR 107 | Temp 98.4°F | Resp 13 | Ht 67.0 in | Wt 274.0 lb

## 2015-09-28 DIAGNOSIS — H548 Legal blindness, as defined in USA: Secondary | ICD-10-CM

## 2015-09-28 DIAGNOSIS — Z794 Long term (current) use of insulin: Secondary | ICD-10-CM

## 2015-09-28 DIAGNOSIS — E119 Type 2 diabetes mellitus without complications: Secondary | ICD-10-CM | POA: Diagnosis not present

## 2015-09-28 DIAGNOSIS — R569 Unspecified convulsions: Secondary | ICD-10-CM

## 2015-09-28 DIAGNOSIS — T7491XA Unspecified adult maltreatment, confirmed, initial encounter: Secondary | ICD-10-CM

## 2015-09-28 DIAGNOSIS — G894 Chronic pain syndrome: Secondary | ICD-10-CM | POA: Diagnosis not present

## 2015-09-28 DIAGNOSIS — IMO0001 Reserved for inherently not codable concepts without codable children: Secondary | ICD-10-CM

## 2015-09-28 DIAGNOSIS — F3160 Bipolar disorder, current episode mixed, unspecified: Secondary | ICD-10-CM

## 2015-09-28 DIAGNOSIS — I10 Essential (primary) hypertension: Secondary | ICD-10-CM

## 2015-09-28 DIAGNOSIS — J438 Other emphysema: Secondary | ICD-10-CM | POA: Diagnosis not present

## 2015-09-28 DIAGNOSIS — E785 Hyperlipidemia, unspecified: Secondary | ICD-10-CM

## 2015-09-28 LAB — POCT GLYCOSYLATED HEMOGLOBIN (HGB A1C): Hemoglobin A1C: 7.4

## 2015-09-28 LAB — GLUCOSE, POCT (MANUAL RESULT ENTRY): POC Glucose: 118 mg/dl — AB (ref 70–99)

## 2015-09-28 MED ORDER — LISINOPRIL 10 MG PO TABS: 10.0000 mg | ORAL_TABLET | Freq: Every day | ORAL | Status: AC

## 2015-09-28 MED ORDER — CLOPIDOGREL BISULFATE 75 MG PO TABS: 75.0000 mg | ORAL_TABLET | Freq: Every day | ORAL | Status: AC

## 2015-09-28 MED ORDER — FLUTICASONE-SALMETEROL 100-50 MCG/DOSE IN AEPB: 1.0000 | INHALATION_SPRAY | Freq: Two times a day (BID) | RESPIRATORY_TRACT | Status: AC

## 2015-09-28 MED ORDER — INSULIN NPH ISOPHANE & REGULAR (70-30) 100 UNIT/ML ~~LOC~~ SUSP: 30.0000 [IU] | Freq: Two times a day (BID) | SUBCUTANEOUS | Status: AC

## 2015-09-28 MED ORDER — VENLAFAXINE HCL ER 75 MG PO CP24: 75.0000 mg | ORAL_CAPSULE | Freq: Every day | ORAL | Status: AC

## 2015-09-28 MED ORDER — SIMVASTATIN 20 MG PO TABS: 20.0000 mg | ORAL_TABLET | Freq: Every day | ORAL | Status: AC

## 2015-09-28 MED ORDER — ALBUTEROL SULFATE HFA 108 (90 BASE) MCG/ACT IN AERS: 2.0000 | INHALATION_SPRAY | Freq: Four times a day (QID) | RESPIRATORY_TRACT | Status: AC | PRN

## 2015-09-28 MED ORDER — ACETAMINOPHEN-CODEINE #3 300-30 MG PO TABS: 1.0000 | ORAL_TABLET | Freq: Four times a day (QID) | ORAL | Status: AC | PRN

## 2015-09-28 MED FILL — ACETAMINOPHEN/COD #3 TABLET: 300-30 | 3 days supply | Qty: 12 | Fill #0

## 2015-09-28 NOTE — Telephone Encounter (Signed)
At the request of Dr Jarold Song, call placed to [facility] # [phone] to inquire about the patient's home environment.  Spoke to [facility], who stated that the patient's nurse, Colletta Maryland is no longer working for them.  The new nurse, Lauralyn Primes, is scheduled to see the patient tomorrow.  Pat reviewed the documentation that they have from the home visits on record and she stated that no concerns about the patient's home/ living situation were noted.  Requested that the nurse call the [facility] tomorrow after her visit with an update on the patient's status at home. Instructed her to call Vicente Males at # [phone].
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
## 2015-09-28 NOTE — Progress Notes (Signed)
Called [facility] [employer] today, on behalf of pt, left HIPPA-compliant message to return call to [place] at [facility] at [phone].
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
## 2015-09-29 ENCOUNTER — Encounter: Payer: Self-pay | Admitting: Family Medicine

## 2015-09-29 ENCOUNTER — Telehealth: Payer: Self-pay | Admitting: Clinical

## 2015-09-29 ENCOUNTER — Encounter: Payer: Self-pay | Admitting: Clinical
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
## 2015-09-29 NOTE — Telephone Encounter (Signed)
Follow-up on Kathy Ellis, plans still in place to move to [place] on Friday; she has taken her medications as prescribed today, and says she will call [employer] before 6pm to confirm that she gotten on the [employer] bus.

## 2015-10-04 ENCOUNTER — Telehealth: Payer: Self-pay | Admitting: Clinical
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
## 2015-10-08 ENCOUNTER — Telehealth: Payer: Self-pay | Admitting: Clinical
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
## 2015-10-18 ENCOUNTER — Encounter: Payer: Self-pay | Admitting: *Deleted

## 2015-10-18 NOTE — Telephone Encounter (Signed)
Error

## 2016-07-08 IMAGING — CT CT ANGIO CHEST
2 of 6 series · 18 of 36 positions shown · IV contrast (OMNIPAQUE)
Comparison: None.

CLINICAL DATA: 44-year-old female with acute shortness of breath
and cough for 2 days, as well as abdominal and pelvic pain, nausea
and vomiting for 2 weeks.



[Series 9: pe thins @ 1mm · axial · 0.71mm/px · z∈[-256,-20]mm · 17 of 264 slices shown]
[im 14/264  lung]
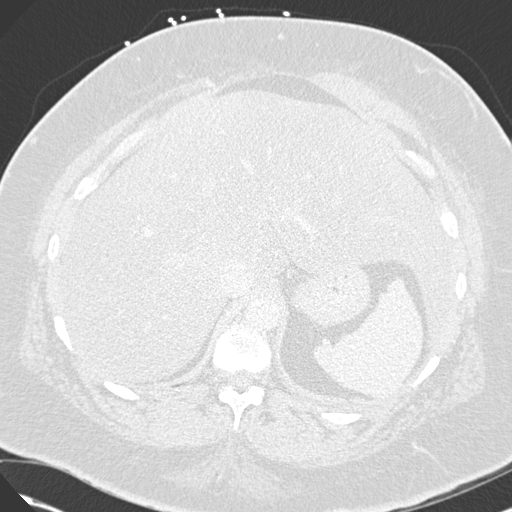
[im 27/264  mediastinal]
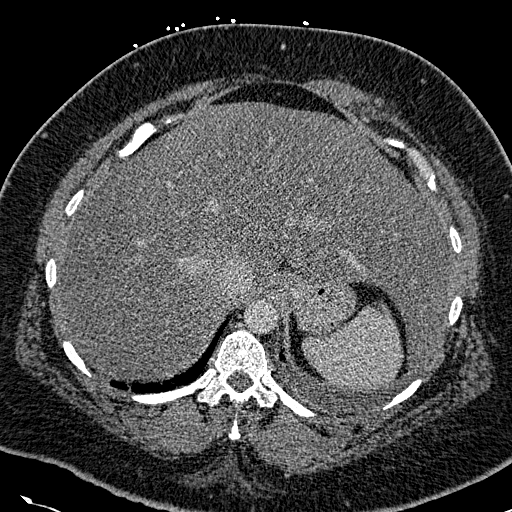
[im 40/264  lung]
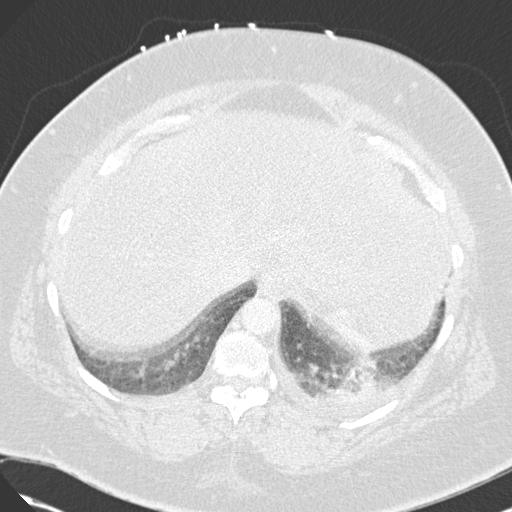
[im 53/264  mediastinal]
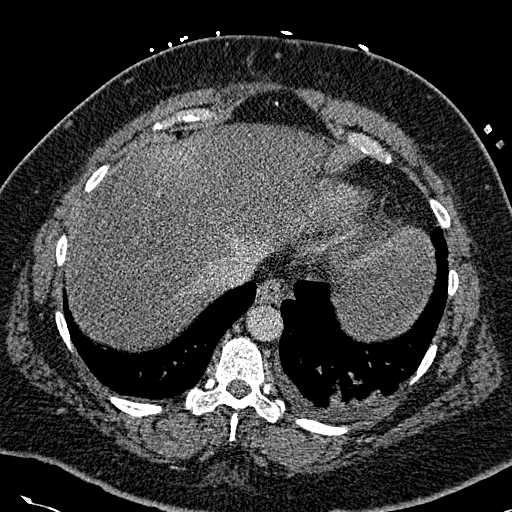
[im 79/264  lung]
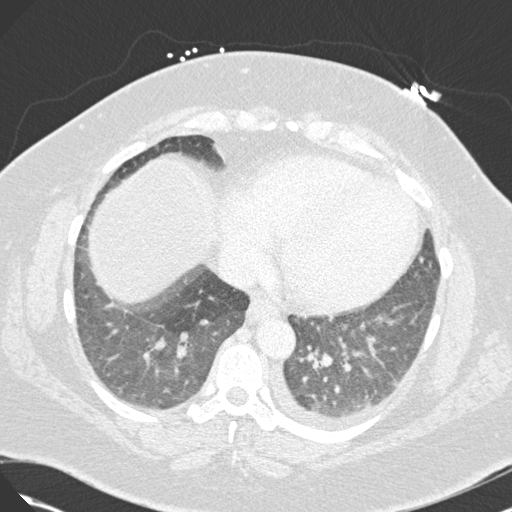
[im 93/264  mediastinal]
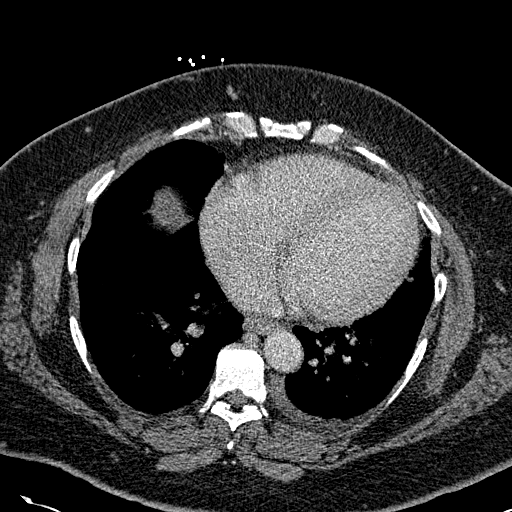
[im 106/264  lung]
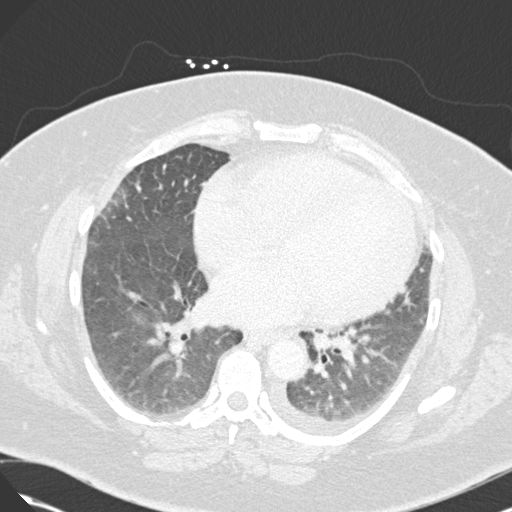
[im 119/264  mediastinal]
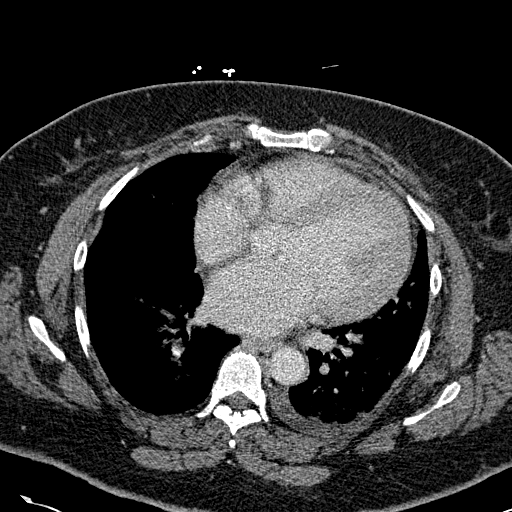
[im 132/264  lung]
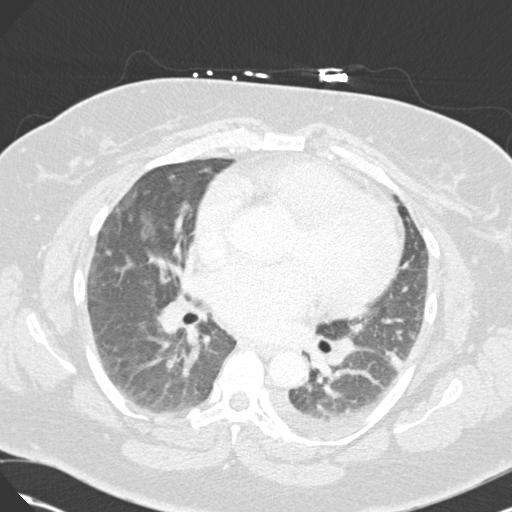
[im 145/264  mediastinal]
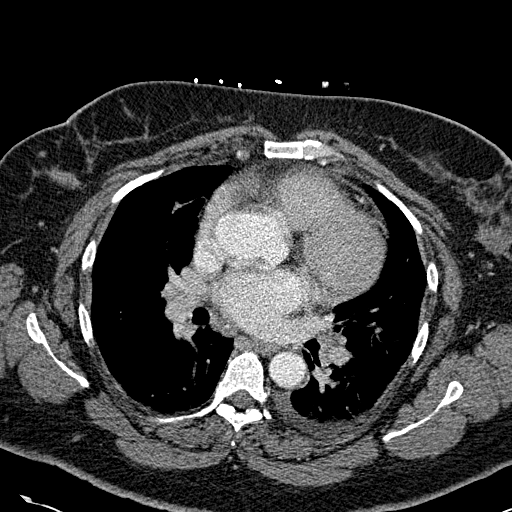
[im 158/264  lung]
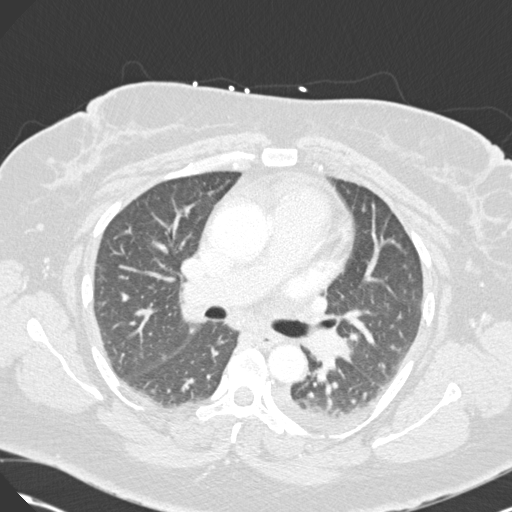
[im 171/264  mediastinal]
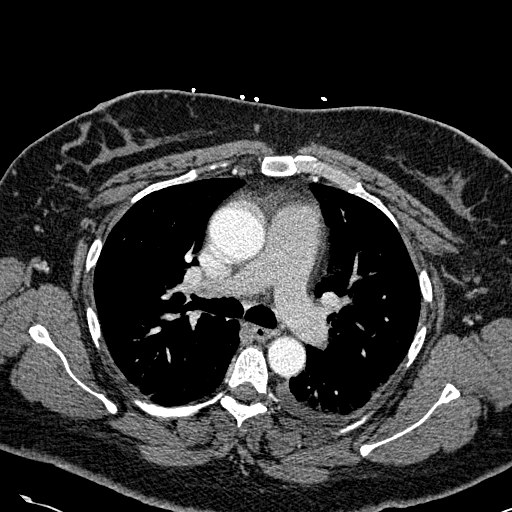
[im 185/264  lung]
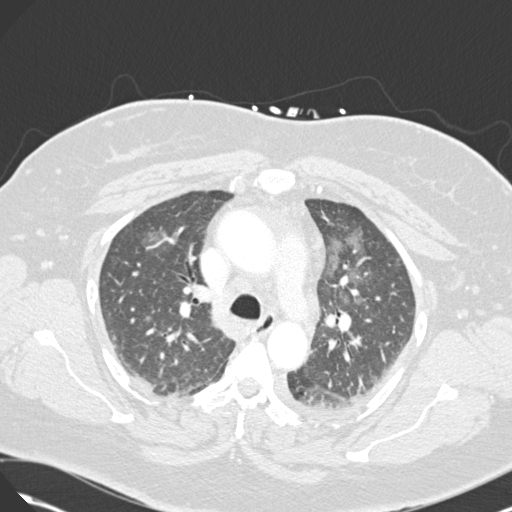
[im 211/264  mediastinal]
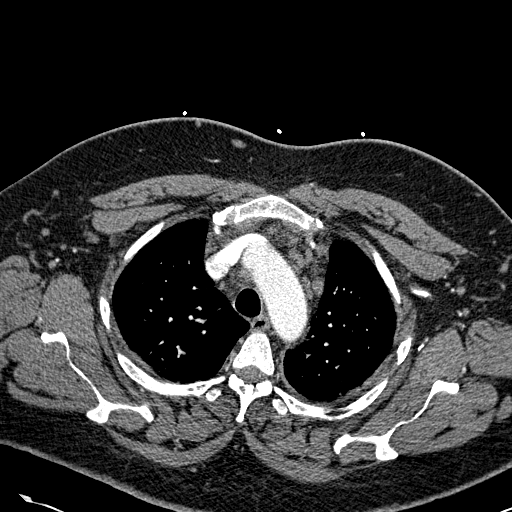
[im 224/264  lung]
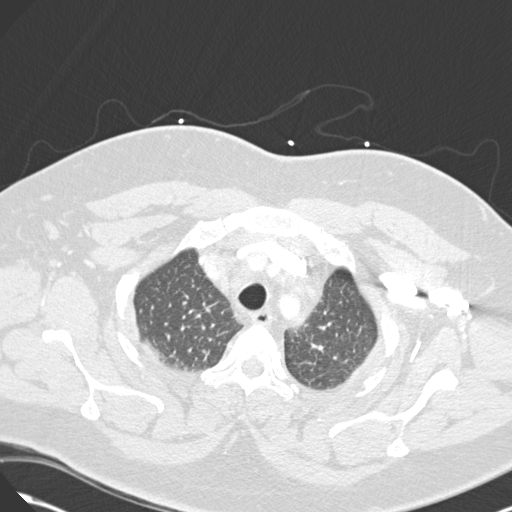
[im 237/264  mediastinal]
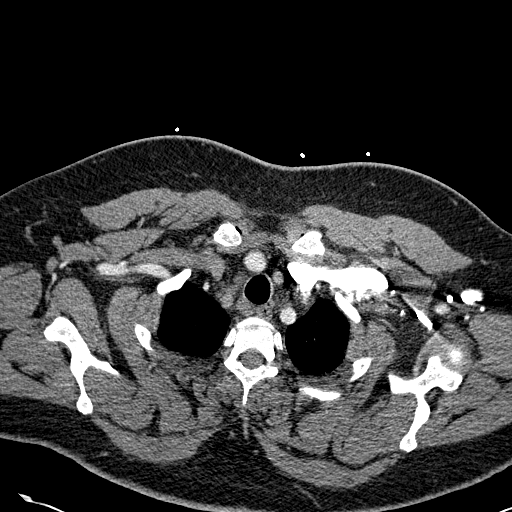
[im 250/264  lung]
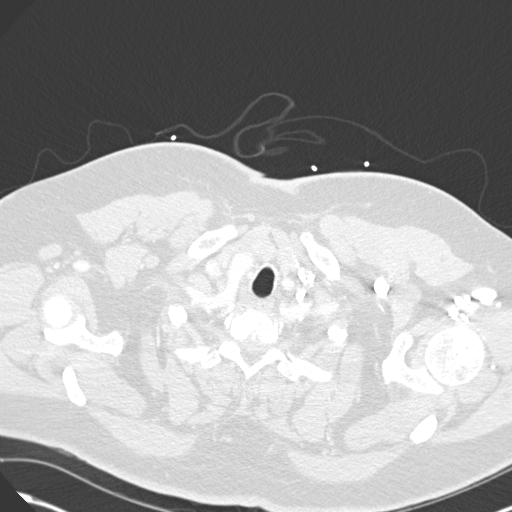

[Series 602: <mpr thick range> · coronal · 0.71mm/px · 1 of 163 slices shown]
[im 82/163  mediastinal]
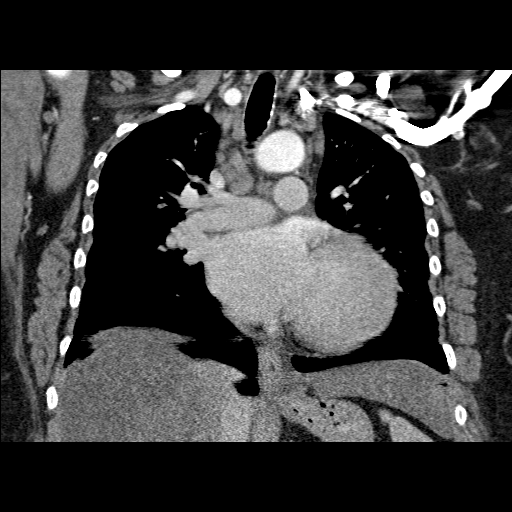

[18 of 36 positions shown; findings below may reference images not displayed]

The abdomen and pelvis study was performed and subsequently, the CTA
chest was ordered.

Suboptimal opacification of the pulmonary arterial system was noted
on the CTA chest and the patient received 175 cc of intravenous
contrast. After discussion with Kayce Jiron, PA, a repeat CTA chest
was not performed given the amount of intravenous contrast and face
history of diabetes.

CONTRAST:  175mL OMNIPAQUE IOHEXOL 350 MG/ML SOLN
FINDINGS: CTA CHEST FINDINGS

This study is suboptimal for the evaluation of pulmonary emboli
secondary to poor pulmonary arterial contrast opacification and
respiratory motion artifact.

Mediastinum/Nodes: No large main pulmonary emboli are identified.
There is no evidence of thoracic aortic aneurysm or dissection.
Cardiomegaly is present. There is no evidence of pericardial
effusion.

Lungs/Pleura: A small left pleural effusion is noted. Central
ground-glass opacities are nonspecific and may represent infection,
inflammation versus edema. Focal left lower lobe basilar opacity is
noted, favor pneumonia over atelectasis. There is no evidence of
pneumothorax. No endobronchial or endotracheal lesions are
identified.

Musculoskeletal: No acute or suspicious abnormalities.

CT ABDOMEN and PELVIS FINDINGS

Hepatobiliary: Moderate-severe hepatic steatosis identified. The
patient is status post cholecystectomy. There is no evidence of
biliary dilatation.

Pancreas: Unremarkable

Spleen: Unremarkable

Adrenals/Urinary Tract: The kidneys, adrenal glands and bladder are
unremarkable except for small right renal cysts.

Stomach/Bowel: Unremarkable. There is no evidence of bowel
obstruction or focal bowel wall thickening. The appendix is normal.

Vascular/Lymphatic: No enlarged lymph nodes or abdominal aortic
aneurysm.

Reproductive: 2.5 cm left ovarian cyst/follicle is noted. The uterus
and right adnexal regions are unremarkable.

Other: No free fluid, abscess or pneumoperitoneum.

Musculoskeletal: No acute or suspicious abnormalities.

Review of the MIP images confirms the above findings.
IMPRESSION: Left lower lobe basilar opacity - favor pneumonia over atelectasis.
Small left pleural effusion.

Mild central ground-glass opacities which are nonspecific and may
represent edema, inflammation or infection.

Suboptimal for evaluation of pulmonary emboli.

No acute abnormality within the abdomen or pelvis. Moderate to
severe hepatic steatosis.
